# Patient Record
Sex: Female | Born: 1940
Health system: Southern US, Community
[De-identification: ages and names within clinical notes are randomized; demographics above are authoritative.]

## PROBLEM LIST (undated history)

## (undated) DIAGNOSIS — R21 Rash and other nonspecific skin eruption: Secondary | ICD-10-CM

## (undated) DIAGNOSIS — M2550 Pain in unspecified joint: Secondary | ICD-10-CM

## (undated) DIAGNOSIS — M2540 Effusion, unspecified joint: Secondary | ICD-10-CM

## (undated) DIAGNOSIS — R351 Nocturia: Secondary | ICD-10-CM

## (undated) DIAGNOSIS — Z860100 Personal history of colon polyps, unspecified: Secondary | ICD-10-CM

## (undated) DIAGNOSIS — M549 Dorsalgia, unspecified: Secondary | ICD-10-CM

## (undated) DIAGNOSIS — H269 Unspecified cataract: Secondary | ICD-10-CM

## (undated) DIAGNOSIS — J189 Pneumonia, unspecified organism: Secondary | ICD-10-CM

## (undated) DIAGNOSIS — K5790 Diverticulosis of intestine, part unspecified, without perforation or abscess without bleeding: Secondary | ICD-10-CM

## (undated) DIAGNOSIS — K649 Unspecified hemorrhoids: Secondary | ICD-10-CM

## (undated) DIAGNOSIS — R531 Weakness: Secondary | ICD-10-CM

## (undated) DIAGNOSIS — I10 Essential (primary) hypertension: Secondary | ICD-10-CM

## (undated) DIAGNOSIS — D649 Anemia, unspecified: Secondary | ICD-10-CM

## (undated) DIAGNOSIS — M1990 Unspecified osteoarthritis, unspecified site: Secondary | ICD-10-CM

## (undated) DIAGNOSIS — G4700 Insomnia, unspecified: Secondary | ICD-10-CM

## (undated) DIAGNOSIS — Z8709 Personal history of other diseases of the respiratory system: Secondary | ICD-10-CM

## (undated) DIAGNOSIS — M1712 Unilateral primary osteoarthritis, left knee: Secondary | ICD-10-CM

## (undated) HISTORY — PX: APPENDECTOMY: SHX54

## (undated) HISTORY — PX: COLONOSCOPY: SHX174

## (undated) HISTORY — PX: EYE SURGERY: SHX253

## (undated) HISTORY — PX: TONSILLECTOMY: SUR1361

## (undated) HISTORY — DX: Anemia, unspecified: D64.9

## (undated) HISTORY — DX: Essential (primary) hypertension: I10

## (undated) HISTORY — DX: Unspecified cataract: H26.9

## (undated) HISTORY — DX: Unspecified osteoarthritis, unspecified site: M19.90

---

## 2001-10-13 ENCOUNTER — Other Ambulatory Visit: Admission: RE | Admit: 2001-10-13 | Discharge: 2001-10-13 | Payer: Self-pay | Admitting: Obstetrics and Gynecology

## 2003-04-06 ENCOUNTER — Other Ambulatory Visit: Admission: RE | Admit: 2003-04-06 | Discharge: 2003-04-06 | Payer: Self-pay | Admitting: Obstetrics and Gynecology

## 2005-12-31 ENCOUNTER — Ambulatory Visit (HOSPITAL_COMMUNITY): Admission: RE | Admit: 2005-12-31 | Discharge: 2005-12-31 | Payer: Self-pay | Admitting: Neurosurgery

## 2010-10-22 ENCOUNTER — Other Ambulatory Visit: Payer: Self-pay | Admitting: Obstetrics and Gynecology

## 2010-10-22 DIAGNOSIS — N6320 Unspecified lump in the left breast, unspecified quadrant: Secondary | ICD-10-CM

## 2010-11-03 ENCOUNTER — Ambulatory Visit
Admission: RE | Admit: 2010-11-03 | Discharge: 2010-11-03 | Disposition: A | Discharge status: Home / Self Care | Payer: Medicare Other | Source: Ambulatory Visit | Attending: Obstetrics and Gynecology | Admitting: Obstetrics and Gynecology

## 2010-11-03 DIAGNOSIS — N6320 Unspecified lump in the left breast, unspecified quadrant: Secondary | ICD-10-CM

## 2013-11-24 LAB — HM COLONOSCOPY

## 2013-11-24 LAB — HM DEXA SCAN

## 2013-11-24 LAB — HM MAMMOGRAPHY: HM Mammogram: NORMAL (ref 0–4)

## 2014-12-21 ENCOUNTER — Ambulatory Visit (INDEPENDENT_AMBULATORY_CARE_PROVIDER_SITE_OTHER): Payer: Medicare Other | Admitting: Emergency Medicine

## 2014-12-21 VITALS — BP 140/80 | HR 75 | Temp 98.8°F | Resp 20 | Ht 63.0 in | Wt 172.0 lb

## 2014-12-21 DIAGNOSIS — M170 Bilateral primary osteoarthritis of knee: Secondary | ICD-10-CM

## 2014-12-21 DIAGNOSIS — Z01818 Encounter for other preprocedural examination: Secondary | ICD-10-CM

## 2014-12-21 NOTE — Patient Instructions (Signed)

## 2014-12-21 NOTE — Progress Notes (Signed)
Subjective:  Patient ID: Kristina Richard, female    DOB: 1941/06/04  Age: 74 y.o. MRN: [id number]  CC: Follow-up   HPI Kristina Richard presents  for preoperative clearance. She has osteoarthritis in both knees and is scheduled for left total knee arthroplasty in about 6 weeks.  She has no significant past medical history except childhood appendectomy and cataract extraction. Current colonoscopies. She is current on her Pap smear and breast mammograms. She takes no medication on a regular basis. She is a nonsmoker. She denies any current medical complaint.  History Kristina Richard has a past medical history of Arthritis.   She has past surgical history that includes Eye surgery.   Her  family history includes Cancer in her mother; Heart disease in her brother; Hyperlipidemia in her father; Hypertension in her father and maternal grandmother.  She   reports that she has never smoked. She has never used smokeless tobacco. She reports that she drinks alcohol. She reports that she does not use illicit drugs.  No outpatient prescriptions prior to visit.   No facility-administered medications prior to visit.    History   Social History  . Marital Status: Single    Spouse Name: N/A  . Number of Children: N/A  . Years of Education: N/A   Social History Main Topics  . Smoking status: Never Smoker   . Smokeless tobacco: Never Used  . Alcohol Use: 0.0 oz/week    0 Standard drinks or equivalent per week     Comment: social use  . Drug Use: No  . Sexual Activity: Not on file   Other Topics Concern  . None   Social History Narrative  . None     Review of Systems  Constitutional: Negative for fever, chills and appetite change.  HENT: Negative for congestion, ear pain, postnasal drip, sinus pressure and sore throat.   Eyes: Negative for pain and redness.  Respiratory: Negative for cough, shortness of breath and wheezing.   Cardiovascular: Negative for leg swelling.    Gastrointestinal: Negative for nausea, vomiting, abdominal pain, diarrhea, constipation and blood in stool.  Endocrine: Negative for polyuria.  Genitourinary: Negative for dysuria, urgency, frequency and flank pain.  Musculoskeletal: Positive for joint swelling and arthralgias. Negative for gait problem.  Skin: Negative for rash.  Neurological: Negative for weakness and headaches.  Psychiatric/Behavioral: Negative for confusion and decreased concentration. The patient is not nervous/anxious.     Objective:  BP 140/80 mmHg  Pulse 75  Temp(Src) 98.8 F (37.1 C) (Oral)  Resp 20  Ht 5\' 3"  (1.6 m)  Wt 172 lb (78.019 kg)  BMI 30.48 kg/m2  SpO2 98%  Physical Exam  Constitutional: She is oriented to person, place, and time. She appears well-developed and well-nourished. No distress.  HENT:  Head: Normocephalic and atraumatic.  Right Ear: External ear normal.  Left Ear: External ear normal.  Nose: Nose normal.  Eyes: Conjunctivae and EOM are normal. Pupils are equal, round, and reactive to light. No scleral icterus.  Neck: Normal range of motion. Neck supple. No tracheal deviation present.  Cardiovascular: Normal rate, regular rhythm and normal heart sounds.   Pulmonary/Chest: Effort normal. No respiratory distress. She has no wheezes. She has no rales.  Abdominal: She exhibits no mass. There is no tenderness. There is no rebound and no guarding.  Musculoskeletal: She exhibits no edema.  Lymphadenopathy:    She has no cervical adenopathy.  Neurological: She is alert and oriented to person, place, and time. Coordination normal.  Skin: Skin is warm and dry. No rash noted.  Psychiatric: She has a normal mood and affect. Her behavior is normal.      Assessment & Plan:   Kristina Richard was seen today for follow-up.  Diagnoses and all orders for this visit:  Preoperative clearance  Primary osteoarthritis of both knees   I am having Kristina Richard maintain her Lysine, cholecalciferol,  vitamin E, and CALCIUM MAGNESIUM 750 PO.  Meds ordered this encounter  Medications  . Lysine 500 MG TABS    Sig: Take 1 tablet by mouth daily.  . cholecalciferol (VITAMIN D) 1000 UNITS tablet    Sig: Take 1,000 Units by mouth daily.  . vitamin E 1000 UNIT capsule    Sig: Take 1,000 Units by mouth daily.  Marland Kitchen CALCIUM MAGNESIUM 750 PO    Sig: Take 1 capsule by mouth daily.    Appropriate red flag conditions were discussed with the patient as well as actions that should be taken.  Patient expressed his understanding.  I can find no contraindication to proposed surgical procedure.   Follow-up: Return if symptoms worsen or fail to improve.  Roselee Culver, MD

## 2014-12-31 ENCOUNTER — Telehealth: Payer: Self-pay

## 2014-12-31 NOTE — Telephone Encounter (Signed)
Release sent as Continuity of Care -

## 2014-12-31 NOTE — Telephone Encounter (Signed)
Sherri from [facility] called today requesting the pt's office notes from [phone] in order to proceed with the arthoplasty.

## 2015-02-15 ENCOUNTER — Other Ambulatory Visit (HOSPITAL_COMMUNITY): Payer: Medicare Other

## 2015-02-25 ENCOUNTER — Encounter (HOSPITAL_COMMUNITY): Admission: RE | Payer: Self-pay | Source: Ambulatory Visit

## 2015-02-25 ENCOUNTER — Inpatient Hospital Stay (HOSPITAL_COMMUNITY): Admission: RE | Admit: 2015-02-25 | Payer: Medicare Other | Source: Ambulatory Visit | Admitting: Orthopedic Surgery

## 2015-02-25 SURGERY — ARTHROPLASTY, KNEE, TOTAL
Anesthesia: General | Site: Knee | Laterality: Left

## 2015-06-04 ENCOUNTER — Encounter (HOSPITAL_COMMUNITY): Payer: Self-pay | Admitting: Physician Assistant

## 2015-06-04 DIAGNOSIS — M5136 Other intervertebral disc degeneration, lumbar region: Secondary | ICD-10-CM | POA: Diagnosis present

## 2015-06-04 DIAGNOSIS — M1712 Unilateral primary osteoarthritis, left knee: Secondary | ICD-10-CM | POA: Diagnosis present

## 2015-06-04 DIAGNOSIS — M5126 Other intervertebral disc displacement, lumbar region: Secondary | ICD-10-CM | POA: Diagnosis present

## 2015-06-04 DIAGNOSIS — M5090 Cervical disc disorder, unspecified, unspecified cervical region: Secondary | ICD-10-CM | POA: Diagnosis present

## 2015-06-04 NOTE — H&P (Signed)
TOTAL KNEE ADMISSION H&P  Patient is being admitted for left total knee arthroplasty.  Subjective:  Chief Complaint:left knee pain.  HPI: Kristina Richard, 74 y.o. female, has a history of pain and functional disability in the left knee due to arthritis and has failed non-surgical conservative treatments for greater than 12 weeks to includeNSAID's and/or analgesics, corticosteriod injections, viscosupplementation injections, flexibility and strengthening excercises, supervised PT with diminished ADL's post treatment, use of assistive devices, weight reduction as appropriate and activity modification.  Onset of symptoms was gradual, starting 10 years ago with gradually worsening course since that time. The patient noted no past surgery on the left knee(s).  Patient currently rates pain in the left knee(s) at 9 out of 10 with activity. Patient has night pain, worsening of pain with activity and weight bearing, pain that interferes with activities of daily living, crepitus and joint swelling.  Patient has evidence of subchondral sclerosis, periarticular osteophytes and joint space narrowing by imaging studies.  There is no active infection.  Patient Active Problem List   Diagnosis Date Noted  . Primary localized osteoarthritis of left knee   . Bulging lumbar disc   . Cervical disc disease    Past Medical History  Diagnosis Date  . Arthritis   . Primary localized osteoarthritis of left knee   . Bulging lumbar disc   . Cervical disc disease     Past Surgical History  Procedure Laterality Date  . Eye surgery      No current facility-administered medications for this encounter.  Current outpatient prescriptions:  .  CALCIUM MAGNESIUM 750 PO, Take 1 capsule by mouth daily., Disp: , Rfl:  .  cholecalciferol (VITAMIN D) 1000 UNITS tablet, Take 1,000 Units by mouth daily., Disp: , Rfl:  .  Lysine 500 MG TABS, Take 1 tablet by mouth daily., Disp: , Rfl:  .  vitamin E 1000 UNIT capsule, Take 1,000  Units by mouth daily., Disp: , Rfl:     Allergies  Allergen Reactions  . Codeine     Can do codones like hydrocodone or oxycodone      Social History  Substance Use Topics  . Smoking status: Never Smoker   . Smokeless tobacco: Never Used  . Alcohol Use: 0.0 oz/week    0 Standard drinks or equivalent per week     Comment: social use    Family History  Problem Relation Age of Onset  . Cancer Mother   . Hyperlipidemia Father   . Hypertension Father   . Heart disease Brother   . Hypertension Maternal Grandmother      Review of Systems  Constitutional: Negative.   HENT: Negative.   Eyes: Negative.   Respiratory: Negative.   Cardiovascular: Negative.   Gastrointestinal: Negative.   Genitourinary: Negative.   Musculoskeletal: Positive for back pain and joint pain.  Skin: Negative.   Neurological: Negative.   Endo/Heme/Allergies: Negative.   Psychiatric/Behavioral: Negative.     Objective:  Physical Exam  Constitutional: She is oriented to person, place, and time. She appears well-developed and well-nourished.  HENT:  Head: Normocephalic and atraumatic.  Mouth/Throat: Oropharynx is clear and moist.  Eyes: Pupils are equal, round, and reactive to light.  Neck: Normal range of motion. Neck supple.  Cardiovascular: Normal rate, regular rhythm and normal heart sounds.   Respiratory: Effort normal.  GI: Soft.  Genitourinary:  Not pertinent to current symptomatology therefore not examined.  Musculoskeletal:  Examination of her left knee reveals 2+ effusion with pain medially. Moderate  varus deformity.  Range of motion is from 0-110 degrees. The knee is stable with normal patellar tracking. Examination of the right knee reveals 1+ synovitis. There is a full range of motion. Mild pain. The knee is stable with normal patellar tracking. Vascular exam: pulses 2+ and symmetric.   Neurological: She is alert and oriented to person, place, and time.  Skin: Skin is warm and dry.   Psychiatric: She has a normal mood and affect. Her behavior is normal.    Vital signs in last 24 hours:    Labs:   Estimated body mass index is 30.48 kg/(m^2) as calculated from the following:   Height as of 12/21/14: 5\' 3"  (1.6 m).   Weight as of 12/21/14: 78.019 kg (172 lb).   Imaging Review Plain radiographs demonstrate moderate degenerative joint disease of the left knee(s). The overall alignment ismild varus. The bone quality appears to be good for age and reported activity level.  Assessment/Plan:  End stage arthritis, left knee  Principal Problem:   Primary localized osteoarthritis of left knee Active Problems:   Bulging lumbar disc   Cervical disc disease   The patient history, physical examination, clinical judgment of the provider and imaging studies are consistent with end stage degenerative joint disease of the left knee(s) and total knee arthroplasty is deemed medically necessary. The treatment options including medical management, injection therapy arthroscopy and arthroplasty were discussed at length. The risks and benefits of total knee arthroplasty were presented and reviewed. The risks due to aseptic loosening, infection, stiffness, patella tracking problems, thromboembolic complications and other imponderables were discussed. The patient acknowledged the explanation, agreed to proceed with the plan and consent was signed. Patient is being admitted for inpatient treatment for surgery, pain control, PT, OT, prophylactic antibiotics, VTE prophylaxis, progressive ambulation and ADL's and discharge planning. The patient is planning to be discharged home with home health services and to her sister's house

## 2015-06-12 ENCOUNTER — Ambulatory Visit (INDEPENDENT_AMBULATORY_CARE_PROVIDER_SITE_OTHER): Payer: Medicare Other | Admitting: Family Medicine

## 2015-06-12 ENCOUNTER — Encounter (HOSPITAL_COMMUNITY)
Admission: RE | Admit: 2015-06-12 | Discharge: 2015-06-12 | Disposition: A | Discharge status: Home / Self Care | Payer: Medicare Other | Source: Ambulatory Visit | Attending: Orthopedic Surgery | Admitting: Orthopedic Surgery

## 2015-06-12 ENCOUNTER — Encounter (HOSPITAL_COMMUNITY): Payer: Self-pay

## 2015-06-12 ENCOUNTER — Encounter: Payer: Self-pay | Admitting: Family Medicine

## 2015-06-12 VITALS — BP 160/100 | HR 87 | Temp 98.8°F | Resp 16 | Ht 62.5 in | Wt 165.6 lb

## 2015-06-12 DIAGNOSIS — Z01812 Encounter for preprocedural laboratory examination: Secondary | ICD-10-CM | POA: Insufficient documentation

## 2015-06-12 DIAGNOSIS — R030 Elevated blood-pressure reading, without diagnosis of hypertension: Secondary | ICD-10-CM | POA: Diagnosis not present

## 2015-06-12 DIAGNOSIS — Z01818 Encounter for other preprocedural examination: Secondary | ICD-10-CM | POA: Insufficient documentation

## 2015-06-12 DIAGNOSIS — R0981 Nasal congestion: Secondary | ICD-10-CM | POA: Diagnosis not present

## 2015-06-12 DIAGNOSIS — Z0183 Encounter for blood typing: Secondary | ICD-10-CM | POA: Insufficient documentation

## 2015-06-12 DIAGNOSIS — R9431 Abnormal electrocardiogram [ECG] [EKG]: Secondary | ICD-10-CM | POA: Diagnosis not present

## 2015-06-12 DIAGNOSIS — IMO0001 Reserved for inherently not codable concepts without codable children: Secondary | ICD-10-CM

## 2015-06-12 DIAGNOSIS — M179 Osteoarthritis of knee, unspecified: Secondary | ICD-10-CM | POA: Diagnosis not present

## 2015-06-12 HISTORY — DX: Rash and other nonspecific skin eruption: R21

## 2015-06-12 HISTORY — DX: Pneumonia, unspecified organism: J18.9

## 2015-06-12 HISTORY — DX: Nocturia: R35.1

## 2015-06-12 HISTORY — DX: Diverticulosis of intestine, part unspecified, without perforation or abscess without bleeding: K57.90

## 2015-06-12 HISTORY — DX: Unspecified hemorrhoids: K64.9

## 2015-06-12 HISTORY — DX: Insomnia, unspecified: G47.00

## 2015-06-12 HISTORY — DX: Personal history of colonic polyps: Z86.010

## 2015-06-12 HISTORY — DX: Effusion, unspecified joint: M25.40

## 2015-06-12 HISTORY — DX: Personal history of other diseases of the respiratory system: Z87.09

## 2015-06-12 HISTORY — DX: Dorsalgia, unspecified: M54.9

## 2015-06-12 HISTORY — DX: Pain in unspecified joint: M25.50

## 2015-06-12 HISTORY — DX: Weakness: R53.1

## 2015-06-12 HISTORY — DX: Personal history of colon polyps, unspecified: Z86.0100

## 2015-06-12 LAB — CBC WITH DIFFERENTIAL/PLATELET
BASOS ABS: 0.1 10*3/uL (ref 0.0–0.1)
BASOS PCT: 1 %
EOS ABS: 0.2 10*3/uL (ref 0.0–0.7)
EOS PCT: 3 %
HCT: 45.8 % (ref 36.0–46.0)
Hemoglobin: 15 g/dL (ref 12.0–15.0)
Lymphocytes Relative: 27 %
Lymphs Abs: 1.8 10*3/uL (ref 0.7–4.0)
MCH: 28 pg (ref 26.0–34.0)
MCHC: 32.8 g/dL (ref 30.0–36.0)
MCV: 85.6 fL (ref 78.0–100.0)
MONOS PCT: 9 %
Monocytes Absolute: 0.6 10*3/uL (ref 0.1–1.0)
Neutro Abs: 4.1 10*3/uL (ref 1.7–7.7)
Neutrophils Relative %: 60 %
PLATELETS: 376 10*3/uL (ref 150–400)
RBC: 5.35 MIL/uL — ABNORMAL HIGH (ref 3.87–5.11)
RDW: 12.7 % (ref 11.5–15.5)
WBC: 6.7 10*3/uL (ref 4.0–10.5)

## 2015-06-12 LAB — COMPREHENSIVE METABOLIC PANEL
ALBUMIN: 3.9 g/dL (ref 3.5–5.0)
ALT: 18 U/L (ref 14–54)
ANION GAP: 8 (ref 5–15)
AST: 17 U/L (ref 15–41)
Alkaline Phosphatase: 69 U/L (ref 38–126)
BILIRUBIN TOTAL: 1 mg/dL (ref 0.3–1.2)
BUN: 7 mg/dL (ref 6–20)
CHLORIDE: 101 mmol/L (ref 101–111)
CO2: 30 mmol/L (ref 22–32)
Calcium: 9.3 mg/dL (ref 8.9–10.3)
Creatinine, Ser: 0.67 mg/dL (ref 0.44–1.00)
GFR calc Af Amer: >60 mL/min (ref >60)
GFR calc non Af Amer: >60 mL/min (ref >60)
GLUCOSE: 100 mg/dL — AB (ref 65–99)
POTASSIUM: 3.6 mmol/L (ref 3.5–5.1)
SODIUM: 139 mmol/L (ref 135–145)
TOTAL PROTEIN: 7.1 g/dL (ref 6.5–8.1)

## 2015-06-12 LAB — ABO/RH: ABO/RH(D): O POS

## 2015-06-12 LAB — APTT: APTT: 28 s (ref 24–37)

## 2015-06-12 LAB — TYPE AND SCREEN
ABO/RH(D): O POS
ANTIBODY SCREEN: NEGATIVE

## 2015-06-12 LAB — SURGICAL PCR SCREEN
MRSA, PCR: NEGATIVE
STAPHYLOCOCCUS AUREUS: NEGATIVE

## 2015-06-12 LAB — PROTIME-INR
INR: 0.95 (ref 0.00–1.49)
Prothrombin Time: 12.9 seconds (ref 11.6–15.2)

## 2015-06-12 MED ORDER — CHLORHEXIDINE GLUCONATE 4 % EX LIQD: 60.0000 mL | Freq: Once | CUTANEOUS | Status: DC

## 2015-06-12 MED ORDER — FLUTICASONE PROPIONATE 50 MCG/ACT NA SUSP: 2.0000 | Freq: Every day | NASAL | Status: DC

## 2015-06-12 MED ORDER — POVIDONE-IODINE 7.5 % EX SOLN: Freq: Once | CUTANEOUS | Status: DC

## 2015-06-12 NOTE — Progress Notes (Signed)
[facility] [address] [place] [address] [phone]  Date:  06/12/2015   Name:  Kristina Richard   DOB:  October 18, 1940   MRN:  [id number]  PCP:  Lamar Blinks, MD    Chief Complaint: estab care; sinus headache; and Pre-OP workup this am at [facility]   History of Present Illness:  Kristina Richard is a 74 y.o. very pleasant female patient who presents with the following:  She is here to establish care and discuss a couple of concerns.  She will have a left knee replacement per Dr. Noemi Chapel coming up- this is scheduled on 12/12.  She is excited to have less pain in her knee.  She had her pre-op today.  Noted some Q waves in her chest leads- she has no knowledge of any MI.  She does not have exertional CP.  She is limited some by her orthopedic issues but has decent exercise tolerance.  No old EKG for comparison BP Readings from Last 3 Encounters:  06/12/15 160/100  06/12/15 159/80  12/21/14 140/80   She does not have a history of HTN.    Suspect that elevated BP today may be due to nerves  She declines a flu shot today   She does note that her sinuses feel stuffy- she generally will use sudafed for a day or 2 in this case and they get better   Patient Active Problem List   Diagnosis Date Noted  . Primary localized osteoarthritis of left knee   . Bulging lumbar disc   . Cervical disc disease     Past Medical History  Diagnosis Date  . Arthritis   . Primary localized osteoarthritis of left knee   . Insomnia     takes Xanax nightly as needed for insomnia or anxiety  . Pneumonia     hx of-most recent 5 yrs ago  . History of bronchitis 4 yrs ago  . Weakness     numbness and tingling in both   . Joint pain   . Joint swelling   . Back pain   . Rash     upper body and states that it nefver goes away.Saw a dermatologist  . Hemorrhoids   . History of colon polyps   . Nocturia   . Diverticulosis     Past Surgical History  Procedure Laterality Date   . Tonsillectomy    . Appendectomy    . Colonoscopy    . Eye surgery Bilateral     cataract    Social History  Substance Use Topics  . Smoking status: Never Smoker   . Smokeless tobacco: Never Used  . Alcohol Use: No    Family History  Problem Relation Age of Onset  . Cancer Mother   . Hyperlipidemia Father   . Hypertension Father   . Heart disease Brother   . Hypertension Maternal Grandmother     Allergies  Allergen Reactions  . Morphine And Related     Doesn't want any morphine and sick to stomach  . Codeine Other (See Comments)    Can do codones like hydrocodone or oxycodone    Medication list has been reviewed and updated.  Current Outpatient Prescriptions on File Prior to Visit  Medication Sig Dispense Refill  . LORazepam (ATIVAN) 2 MG tablet Take 1 mg by mouth at bedtime as needed for anxiety or sleep.     Marland Kitchen NIACIN PO Take 400 mg by mouth daily.    . traMADol Veatrice Bourbon)  50 MG tablet Take 50 mg by mouth every 6 (six) hours as needed for moderate pain.    . cholecalciferol (VITAMIN D) 1000 UNITS tablet Take 1,000 Units by mouth daily.    Marland Kitchen Lysine 500 MG TABS Take 500 mg by mouth daily.     . vitamin E 1000 UNIT capsule Take 1,000 Units by mouth daily.     Current Facility-Administered Medications on File Prior to Visit  Medication Dose Route Frequency Provider Last Rate Last Dose  . chlorhexidine (HIBICLENS) 4 % liquid 4 application  60 mL Topical Once Kirstin Shepperson, PA-C      . chlorhexidine (HIBICLENS) 4 % liquid 4 application  60 mL Topical Once Kirstin Shepperson, PA-C      . povidone-iodine (BETADINE) 7.5 % scrub   Topical Once Kirstin Shepperson, PA-C        Review of Systems:  As per HPI- otherwise negative.   Physical Examination: Filed Vitals:   06/12/15 1534 06/12/15 1540  BP: 160/92 160/100  Pulse: 87   Temp: 98.8 F (37.1 C)   Resp: 16    Filed Vitals:   06/12/15 1534  Height: 5' 2.5" (1.588 m)  Weight: 165 lb 9.6 oz (75.116 kg)    Body mass index is 29.79 kg/(m^2). Ideal Body Weight: Weight in (lb) to have BMI = 25: 138.6  GEN: WDWN, NAD, Non-toxic, A & O x 3, well appearing  HEENT: Atraumatic, Normocephalic. Neck supple. No masses, No LAD.  Bilateral TM wnl, oropharynx normal.  PEERL,EOMI.  Nasal cavity injection Ears and Nose: No external deformity. CV: RRR, No M/G/R. No JVD. No thrill. No extra heart sounds. PULM: CTA B, no wheezes, crackles, rhonchi. No retractions. No resp. distress. No accessory muscle use. EXTR: No c/c/e NEURO Normal gait.  PSYCH: Normally interactive. Conversant. Not depressed or anxious appearing.  Calm demeanor.    Assessment and Plan: Nasal congestion - Plan: fluticasone (FLONASE) 50 MCG/ACT nasal spray  Elevated BP  BP is up today- this is not usual for her.  May be due to pre-op nerves.  She will check her BP at home and if still elevated she will call me; in that case may start a low dose of meds so that there are no problems with proceeding to the OR Advised the sudafed may raise her BP- would be ok for a day or two but otherwise prefer that she try flonase    Signed Lamar Blinks, MD

## 2015-06-12 NOTE — Progress Notes (Addendum)
Cardiologist denies having one  Medical Md is Starr  Echo/stress test/heart cath denies ever having  EKG denies ever having one  CXR denies having one in past yr

## 2015-06-12 NOTE — Patient Instructions (Signed)
Best of luck with your operation- if we can help let me know Keep an eye on your blood pressure -if you are running higher than 140/90 please let me know.   In that case we may need to start a low dose of a BP medication  Try the flonase for your nasal symptoms if you like.

## 2015-06-12 NOTE — Pre-Procedure Instructions (Signed)
Kristina Richard  06/12/2015      [facility] - [place], [place] - [address] AT [address] [place] [address] Phone: [phone] Fax: [phone]    Your procedure is scheduled on Mon, Dec 12 @ 7:15 AM  Report to [facility] Admitting at 5:30 AM  Call this number if you have problems the morning of surgery:  [phone]   Remember:  Do not eat food or drink liquids after midnight.  Take these medicines the morning of surgery with A SIP OF WATER Tramadol(Ultram-if needed)             Stop taking your Vitamins along with any Herbal Medications a week prior to surgery. No Goody's,BC's,Aleve,Aspirin,Ibuprofen,Advil,Motrin,or Fish Oil.   Do not wear jewelry, make-up or nail polish.  Do not wear lotions, powders, or perfumes.  You may wear deodorant.  Do not shave 48 hours prior to surgery.    Do not bring valuables to the hospital.  [facility] is not responsible for any belongings or valuables.  Contacts, dentures or bridgework may not be worn into surgery.  Leave your suitcase in the car.  After surgery it may be brought to your room.  For patients admitted to the hospital, discharge time will be determined by your treatment team.  Patients discharged the day of surgery will not be allowed to drive home.    Special instructions:  [facility] - Preparing for Surgery  Before surgery, you can play an important role.  Because skin is not sterile, your skin needs to be as free of germs as possible.  You can reduce the number of germs on you skin by washing with CHG (chlorahexidine gluconate) soap before surgery.  CHG is an antiseptic cleaner which kills germs and bonds with the skin to continue killing germs even after washing.  Please DO NOT use if you have an allergy to CHG or antibacterial soaps.  If your skin becomes reddened/irritated stop using the CHG and inform your nurse when you arrive at Short  Stay.  Do not shave (including legs and underarms) for at least 48 hours prior to the first CHG shower.  You may shave your face.  Please follow these instructions carefully:   1.  Shower with CHG Soap the night before surgery and the                                morning of Surgery.  2.  If you choose to wash your hair, wash your hair first as usual with your       normal shampoo.  3.  After you shampoo, rinse your hair and body thoroughly to remove the                      Shampoo.  4.  Use CHG as you would any other liquid soap.  You can apply chg directly       to the skin and wash gently with scrungie or a clean washcloth.  5.  Apply the CHG Soap to your body ONLY FROM THE NECK DOWN.        Do not use on open wounds or open sores.  Avoid contact with your eyes,       ears, mouth and genitals (private parts).  Wash genitals (private parts)  with your normal soap.  6.  Wash thoroughly, paying special attention to the area where your surgery        will be performed.  7.  Thoroughly rinse your body with warm water from the neck down.  8.  DO NOT shower/wash with your normal soap after using and rinsing off       the CHG Soap.  9.  Pat yourself dry with a clean towel.            10.  Wear clean pajamas.            11.  Place clean sheets on your bed the night of your first shower and do not        sleep with pets.  Day of Surgery  Do not apply any lotions/deoderants the morning of surgery.  Please wear clean clothes to the hospital/surgery center.    Please read over the following fact sheets that you were given. Pain Booklet, Coughing and Deep Breathing, Blood Transfusion Information, MRSA Information and Surgical Site Infection Prevention

## 2015-06-13 LAB — URINE CULTURE: CULTURE: NO GROWTH

## 2015-06-13 NOTE — Progress Notes (Signed)
Anesthesia Chart Review:  Pt is 74 year old female scheduled for L total knee arthroplasty on 06/24/2015 with Dr. Noemi Chapel.   PCP is Dr. Lamar Blinks; pt had first visit to establish care 06/12/15.   PMH includes:  Arthritis. Never smoker. BMI 30  Preoperative labs reviewed.    EKG 06/12/15: NSR. Cannot rule out Anteroseptal infarct, age undetermined  Reviewed case with Dr. Orene Desanctis.   If no changes, I anticipate pt can proceed with surgery as scheduled.   Willeen Cass, FNP-BC [facility]/Anesthesiology Phone: [phone] 06/13/2015 2:46 PM

## 2015-06-23 MED ORDER — CEFAZOLIN SODIUM-DEXTROSE 2-3 GM-% IV SOLR
2.0000 g | INTRAVENOUS | Status: AC
  Administered 2015-06-24: 2 g via INTRAVENOUS
  Filled 2015-06-23: qty 50

## 2015-06-24 ENCOUNTER — Encounter (HOSPITAL_COMMUNITY): Payer: Self-pay | Admitting: Certified Registered"

## 2015-06-24 ENCOUNTER — Inpatient Hospital Stay (HOSPITAL_COMMUNITY): Payer: Medicare Other | Admitting: Vascular Surgery

## 2015-06-24 ENCOUNTER — Encounter (HOSPITAL_COMMUNITY)
Admission: RE | Disposition: A | Discharge status: Home Health | Payer: Self-pay | Source: Ambulatory Visit | Attending: Orthopedic Surgery

## 2015-06-24 ENCOUNTER — Inpatient Hospital Stay (HOSPITAL_COMMUNITY)
Admission: RE | Admit: 2015-06-24 | Discharge: 2015-06-26 | DRG: 470 | Disposition: A | Discharge status: Home Health | Payer: Medicare Other | Source: Ambulatory Visit | Attending: Orthopedic Surgery | Admitting: Orthopedic Surgery

## 2015-06-24 ENCOUNTER — Inpatient Hospital Stay (HOSPITAL_COMMUNITY): Payer: Medicare Other | Admitting: Certified Registered"

## 2015-06-24 DIAGNOSIS — M5126 Other intervertebral disc displacement, lumbar region: Secondary | ICD-10-CM | POA: Diagnosis present

## 2015-06-24 DIAGNOSIS — M179 Osteoarthritis of knee, unspecified: Secondary | ICD-10-CM | POA: Diagnosis present

## 2015-06-24 DIAGNOSIS — M1712 Unilateral primary osteoarthritis, left knee: Principal | ICD-10-CM | POA: Diagnosis present

## 2015-06-24 DIAGNOSIS — Z8249 Family history of ischemic heart disease and other diseases of the circulatory system: Secondary | ICD-10-CM | POA: Diagnosis not present

## 2015-06-24 DIAGNOSIS — M5136 Other intervertebral disc degeneration, lumbar region: Secondary | ICD-10-CM | POA: Diagnosis present

## 2015-06-24 DIAGNOSIS — M25562 Pain in left knee: Secondary | ICD-10-CM | POA: Diagnosis present

## 2015-06-24 DIAGNOSIS — M1710 Unilateral primary osteoarthritis, unspecified knee: Secondary | ICD-10-CM | POA: Diagnosis present

## 2015-06-24 DIAGNOSIS — M5090 Cervical disc disorder, unspecified, unspecified cervical region: Secondary | ICD-10-CM | POA: Diagnosis present

## 2015-06-24 HISTORY — PX: TOTAL KNEE ARTHROPLASTY: SHX125

## 2015-06-24 HISTORY — DX: Unilateral primary osteoarthritis, left knee: M17.12

## 2015-06-24 SURGERY — ARTHROPLASTY, KNEE, TOTAL
Anesthesia: General | Site: Knee | Laterality: Left

## 2015-06-24 MED ORDER — LORAZEPAM 1 MG PO TABS: 1.0000 mg | ORAL_TABLET | Freq: Every evening | ORAL | Status: DC | PRN

## 2015-06-24 MED ORDER — DEXAMETHASONE SODIUM PHOSPHATE 10 MG/ML IJ SOLN
10.0000 mg | Freq: Three times a day (TID) | INTRAMUSCULAR | Status: AC
  Administered 2015-06-24 – 2015-06-25 (×3): 10 mg via INTRAVENOUS
  Filled 2015-06-24 (×3): qty 1

## 2015-06-24 MED ORDER — CEFAZOLIN SODIUM-DEXTROSE 2-3 GM-% IV SOLR
2.0000 g | Freq: Four times a day (QID) | INTRAVENOUS | Status: AC
  Administered 2015-06-24 (×2): 2 g via INTRAVENOUS
  Filled 2015-06-24 (×2): qty 50

## 2015-06-24 MED ORDER — HYDROMORPHONE HCL 1 MG/ML IJ SOLN
INTRAMUSCULAR | Status: AC
  Filled 2015-06-24: qty 1

## 2015-06-24 MED ORDER — PHENOL 1.4 % MT LIQD: 1.0000 | OROMUCOSAL | Status: DC | PRN

## 2015-06-24 MED ORDER — DOCUSATE SODIUM 100 MG PO CAPS
100.0000 mg | ORAL_CAPSULE | Freq: Two times a day (BID) | ORAL | Status: DC
  Administered 2015-06-24 – 2015-06-26 (×4): 100 mg via ORAL
  Filled 2015-06-24 (×4): qty 1

## 2015-06-24 MED ORDER — METOCLOPRAMIDE HCL 5 MG/ML IJ SOLN
5.0000 mg | Freq: Three times a day (TID) | INTRAMUSCULAR | Status: DC | PRN
  Administered 2015-06-24: 10 mg via INTRAVENOUS
  Filled 2015-06-24: qty 2

## 2015-06-24 MED ORDER — PHENYLEPHRINE 40 MCG/ML (10ML) SYRINGE FOR IV PUSH (FOR BLOOD PRESSURE SUPPORT)
PREFILLED_SYRINGE | INTRAVENOUS | Status: AC
  Filled 2015-06-24: qty 10

## 2015-06-24 MED ORDER — PROPOFOL 500 MG/50ML IV EMUL
INTRAVENOUS | Status: DC | PRN
  Administered 2015-06-24: 75 ug/kg/min via INTRAVENOUS

## 2015-06-24 MED ORDER — MIDAZOLAM HCL 5 MG/5ML IJ SOLN
INTRAMUSCULAR | Status: DC | PRN
  Administered 2015-06-24 (×2): 1 mg via INTRAVENOUS

## 2015-06-24 MED ORDER — HYDROMORPHONE HCL 1 MG/ML IJ SOLN
0.5000 mg | INTRAMUSCULAR | Status: DC | PRN
  Administered 2015-06-24 – 2015-06-26 (×6): 1 mg via INTRAVENOUS
  Filled 2015-06-24 (×6): qty 1

## 2015-06-24 MED ORDER — LACTATED RINGERS IV SOLN
INTRAVENOUS | Status: DC | PRN
  Administered 2015-06-24 (×2): via INTRAVENOUS

## 2015-06-24 MED ORDER — PHENYLEPHRINE HCL 10 MG/ML IJ SOLN
INTRAMUSCULAR | Status: DC | PRN
  Administered 2015-06-24 (×3): 40 ug via INTRAVENOUS

## 2015-06-24 MED ORDER — APIXABAN 2.5 MG PO TABS
2.5000 mg | ORAL_TABLET | Freq: Two times a day (BID) | ORAL | Status: DC
  Administered 2015-06-25 – 2015-06-26 (×3): 2.5 mg via ORAL
  Filled 2015-06-24 (×3): qty 1

## 2015-06-24 MED ORDER — DEXAMETHASONE SODIUM PHOSPHATE 10 MG/ML IJ SOLN
INTRAMUSCULAR | Status: AC
  Filled 2015-06-24: qty 1

## 2015-06-24 MED ORDER — PROPOFOL 10 MG/ML IV BOLUS
INTRAVENOUS | Status: AC
  Filled 2015-06-24: qty 20

## 2015-06-24 MED ORDER — MENTHOL 3 MG MT LOZG: 1.0000 | LOZENGE | OROMUCOSAL | Status: DC | PRN

## 2015-06-24 MED ORDER — DIPHENHYDRAMINE HCL 12.5 MG/5ML PO ELIX: 12.5000 mg | ORAL_SOLUTION | ORAL | Status: DC | PRN

## 2015-06-24 MED ORDER — BUPIVACAINE-EPINEPHRINE (PF) 0.25% -1:200000 IJ SOLN
INTRAMUSCULAR | Status: AC
  Filled 2015-06-24: qty 30

## 2015-06-24 MED ORDER — ALUM & MAG HYDROXIDE-SIMETH 200-200-20 MG/5ML PO SUSP: 30.0000 mL | ORAL | Status: DC | PRN

## 2015-06-24 MED ORDER — SODIUM CHLORIDE 0.9 % IR SOLN
Status: DC | PRN
  Administered 2015-06-24: 4000 mL

## 2015-06-24 MED ORDER — MIDAZOLAM HCL 2 MG/2ML IJ SOLN
INTRAMUSCULAR | Status: AC
  Filled 2015-06-24: qty 2

## 2015-06-24 MED ORDER — POLYETHYLENE GLYCOL 3350 17 G PO PACK
17.0000 g | PACK | Freq: Two times a day (BID) | ORAL | Status: DC
  Administered 2015-06-25 – 2015-06-26 (×3): 17 g via ORAL
  Filled 2015-06-24 (×4): qty 1

## 2015-06-24 MED ORDER — PROMETHAZINE HCL 25 MG/ML IJ SOLN: 6.2500 mg | INTRAMUSCULAR | Status: DC | PRN

## 2015-06-24 MED ORDER — BUPIVACAINE IN DEXTROSE 0.75-8.25 % IT SOLN
INTRATHECAL | Status: DC | PRN
  Administered 2015-06-24: 1.8 mL via INTRATHECAL

## 2015-06-24 MED ORDER — HYDROMORPHONE HCL 1 MG/ML IJ SOLN
0.2500 mg | INTRAMUSCULAR | Status: DC | PRN
  Administered 2015-06-24 (×4): 0.5 mg via INTRAVENOUS

## 2015-06-24 MED ORDER — FENTANYL CITRATE (PF) 100 MCG/2ML IJ SOLN
INTRAMUSCULAR | Status: DC | PRN
  Administered 2015-06-24: 25 ug via INTRAVENOUS
  Administered 2015-06-24: 50 ug via INTRAVENOUS

## 2015-06-24 MED ORDER — PROPOFOL 10 MG/ML IV BOLUS
INTRAVENOUS | Status: DC | PRN
  Administered 2015-06-24: 10 mg via INTRAVENOUS
  Administered 2015-06-24 (×2): 20 mg via INTRAVENOUS

## 2015-06-24 MED ORDER — DEXAMETHASONE SODIUM PHOSPHATE 10 MG/ML IJ SOLN
INTRAMUSCULAR | Status: DC | PRN
  Administered 2015-06-24: 10 mg via INTRAVENOUS

## 2015-06-24 MED ORDER — NIACIN 500 MG PO TABS
500.0000 mg | ORAL_TABLET | Freq: Every day | ORAL | Status: DC
  Administered 2015-06-26: 500 mg via ORAL
  Filled 2015-06-24 (×3): qty 1

## 2015-06-24 MED ORDER — STERILE WATER FOR INJECTION IJ SOLN
INTRAMUSCULAR | Status: AC
  Filled 2015-06-24: qty 10

## 2015-06-24 MED ORDER — VITAMIN D 1000 UNITS PO TABS
1000.0000 [IU] | ORAL_TABLET | Freq: Every day | ORAL | Status: DC
  Administered 2015-06-24 – 2015-06-26 (×3): 1000 [IU] via ORAL
  Filled 2015-06-24 (×3): qty 1

## 2015-06-24 MED ORDER — ONDANSETRON HCL 4 MG/2ML IJ SOLN
4.0000 mg | Freq: Four times a day (QID) | INTRAMUSCULAR | Status: DC | PRN
  Administered 2015-06-24 – 2015-06-26 (×3): 4 mg via INTRAVENOUS
  Filled 2015-06-24 (×3): qty 2

## 2015-06-24 MED ORDER — EPHEDRINE SULFATE 50 MG/ML IJ SOLN
INTRAMUSCULAR | Status: DC | PRN
  Administered 2015-06-24 (×2): 5 mg via INTRAVENOUS

## 2015-06-24 MED ORDER — BUPIVACAINE-EPINEPHRINE 0.25% -1:200000 IJ SOLN
INTRAMUSCULAR | Status: DC | PRN
  Administered 2015-06-24: 30 mL

## 2015-06-24 MED ORDER — ACETAMINOPHEN 650 MG RE SUPP: 650.0000 mg | Freq: Four times a day (QID) | RECTAL | Status: DC | PRN

## 2015-06-24 MED ORDER — OXYCODONE HCL 5 MG PO TABS
ORAL_TABLET | ORAL | Status: AC
  Filled 2015-06-24: qty 2

## 2015-06-24 MED ORDER — FENTANYL CITRATE (PF) 250 MCG/5ML IJ SOLN
INTRAMUSCULAR | Status: AC
  Filled 2015-06-24: qty 5

## 2015-06-24 MED ORDER — EPHEDRINE SULFATE 50 MG/ML IJ SOLN
INTRAMUSCULAR | Status: AC
  Filled 2015-06-24: qty 1

## 2015-06-24 MED ORDER — CELECOXIB 200 MG PO CAPS
200.0000 mg | ORAL_CAPSULE | Freq: Two times a day (BID) | ORAL | Status: DC
Start: 2015-06-24 — End: 2015-06-26
  Administered 2015-06-24 – 2015-06-26 (×5): 200 mg via ORAL
  Filled 2015-06-24 (×5): qty 1

## 2015-06-24 MED ORDER — ONDANSETRON HCL 4 MG PO TABS
4.0000 mg | ORAL_TABLET | Freq: Four times a day (QID) | ORAL | Status: DC | PRN
  Administered 2015-06-26: 4 mg via ORAL
  Filled 2015-06-24 (×2): qty 1

## 2015-06-24 MED ORDER — POTASSIUM CHLORIDE IN NACL 20-0.9 MEQ/L-% IV SOLN
INTRAVENOUS | Status: DC
  Administered 2015-06-24 (×2): via INTRAVENOUS
  Filled 2015-06-24 (×2): qty 1000

## 2015-06-24 MED ORDER — METOCLOPRAMIDE HCL 5 MG PO TABS
5.0000 mg | ORAL_TABLET | Freq: Three times a day (TID) | ORAL | Status: DC | PRN
  Administered 2015-06-26: 10 mg via ORAL
  Filled 2015-06-24: qty 2

## 2015-06-24 MED ORDER — ACETAMINOPHEN 325 MG PO TABS
650.0000 mg | ORAL_TABLET | Freq: Four times a day (QID) | ORAL | Status: DC | PRN
  Administered 2015-06-26 (×2): 650 mg via ORAL
  Filled 2015-06-24 (×2): qty 2

## 2015-06-24 MED ORDER — ROPIVACAINE HCL 5 MG/ML IJ SOLN
INTRAMUSCULAR | Status: DC | PRN
  Administered 2015-06-24: 25 mL via PERINEURAL

## 2015-06-24 MED ORDER — OXYCODONE HCL 5 MG PO TABS
5.0000 mg | ORAL_TABLET | ORAL | Status: DC | PRN
  Administered 2015-06-24 (×2): 10 mg via ORAL
  Administered 2015-06-25 – 2015-06-26 (×8): 5 mg via ORAL
  Filled 2015-06-24: qty 1
  Filled 2015-06-24: qty 2
  Filled 2015-06-24 (×2): qty 1
  Filled 2015-06-24 (×2): qty 2
  Filled 2015-06-24 (×2): qty 1
  Filled 2015-06-24: qty 2
  Filled 2015-06-24: qty 1

## 2015-06-24 MED ORDER — LACTATED RINGERS IV SOLN: INTRAVENOUS | Status: DC

## 2015-06-24 MED ORDER — FLUTICASONE PROPIONATE 50 MCG/ACT NA SUSP
2.0000 | Freq: Every day | NASAL | Status: DC
  Filled 2015-06-24: qty 16

## 2015-06-24 SURGICAL SUPPLY — 75 items
APL SKNCLS STERI-STRIP NONHPOA (GAUZE/BANDAGES/DRESSINGS) ×1
BANDAGE ESMARK 6X9 LF (GAUZE/BANDAGES/DRESSINGS) ×1 IMPLANT
BENZOIN TINCTURE PRP APPL 2/3 (GAUZE/BANDAGES/DRESSINGS) ×3 IMPLANT
BLADE SAGITTAL 25.0X1.19X90 (BLADE) ×2 IMPLANT
BLADE SAGITTAL 25.0X1.19X90MM (BLADE) ×1
BLADE SAW RECIP 87.9 MT (BLADE) IMPLANT
BLADE SAW SAG 90X13X1.27 (BLADE) ×3 IMPLANT
BLADE SURG 10 STRL SS (BLADE) ×6 IMPLANT
BNDG CMPR 9X6 STRL LF SNTH (GAUZE/BANDAGES/DRESSINGS) ×1
BNDG CMPR MED 15X6 ELC VLCR LF (GAUZE/BANDAGES/DRESSINGS) ×1
BNDG ELASTIC 6X15 VLCR STRL LF (GAUZE/BANDAGES/DRESSINGS) ×3 IMPLANT
BNDG ESMARK 6X9 LF (GAUZE/BANDAGES/DRESSINGS) ×3
BOWL SMART MIX CTS (DISPOSABLE) ×3 IMPLANT
CAPT KNEE TOTAL 3 ATTUNE ×2 IMPLANT
CEMENT HV SMART SET (Cement) ×6 IMPLANT
CLOSURE WOUND 1/2 X4 (GAUZE/BANDAGES/DRESSINGS) ×1
COVER SURGICAL LIGHT HANDLE (MISCELLANEOUS) ×3 IMPLANT
CUFF TOURNIQUET SINGLE 34IN LL (TOURNIQUET CUFF) ×3 IMPLANT
CUFF TOURNIQUET SINGLE 44IN (TOURNIQUET CUFF) IMPLANT
DECANTER SPIKE VIAL GLASS SM (MISCELLANEOUS) ×3 IMPLANT
DRAPE EXTREMITY T 121X128X90 (DRAPE) ×3 IMPLANT
DRAPE INCISE IOBAN 66X45 STRL (DRAPES) ×3 IMPLANT
DRAPE PROXIMA HALF (DRAPES) ×3 IMPLANT
DRAPE U-SHAPE 47X51 STRL (DRAPES) ×3 IMPLANT
DRSG AQUACEL AG ADV 3.5X14 (GAUZE/BANDAGES/DRESSINGS) ×3 IMPLANT
DRSG PAD ABDOMINAL 8X10 ST (GAUZE/BANDAGES/DRESSINGS) ×6 IMPLANT
DURAPREP 26ML APPLICATOR (WOUND CARE) ×6 IMPLANT
ELECT CAUTERY BLADE 6.4 (BLADE) ×3 IMPLANT
ELECT REM PT RETURN 9FT ADLT (ELECTROSURGICAL) ×3
ELECTRODE REM PT RTRN 9FT ADLT (ELECTROSURGICAL) ×1 IMPLANT
EVACUATOR 1/8 PVC DRAIN (DRAIN) IMPLANT
FACESHIELD WRAPAROUND (MASK) ×3 IMPLANT
FACESHIELD WRAPAROUND OR TEAM (MASK) ×1 IMPLANT
GAUZE SPONGE 4X4 12PLY STRL (GAUZE/BANDAGES/DRESSINGS) ×3 IMPLANT
GLOVE BIO SURGEON STRL SZ7 (GLOVE) ×3 IMPLANT
GLOVE BIOGEL PI IND STRL 7.0 (GLOVE) ×1 IMPLANT
GLOVE BIOGEL PI IND STRL 7.5 (GLOVE) ×1 IMPLANT
GLOVE BIOGEL PI INDICATOR 7.0 (GLOVE) ×2
GLOVE BIOGEL PI INDICATOR 7.5 (GLOVE) ×2
GLOVE SS BIOGEL STRL SZ 7.5 (GLOVE) ×1 IMPLANT
GLOVE SUPERSENSE BIOGEL SZ 7.5 (GLOVE) ×2
GOWN STRL REUS W/ TWL LRG LVL3 (GOWN DISPOSABLE) ×1 IMPLANT
GOWN STRL REUS W/ TWL XL LVL3 (GOWN DISPOSABLE) ×2 IMPLANT
GOWN STRL REUS W/TWL LRG LVL3 (GOWN DISPOSABLE) ×3
GOWN STRL REUS W/TWL XL LVL3 (GOWN DISPOSABLE) ×6
HANDPIECE INTERPULSE COAX TIP (DISPOSABLE) ×3
HOOD PEEL AWAY FACE SHEILD DIS (HOOD) ×6 IMPLANT
IMMOBILIZER KNEE 22 UNIV (SOFTGOODS) ×3 IMPLANT
KIT BASIN OR (CUSTOM PROCEDURE TRAY) ×3 IMPLANT
KIT ROOM TURNOVER OR (KITS) ×3 IMPLANT
MANIFOLD NEPTUNE II (INSTRUMENTS) ×3 IMPLANT
MARKER SKIN DUAL TIP RULER LAB (MISCELLANEOUS) ×3 IMPLANT
NS IRRIG 1000ML POUR BTL (IV SOLUTION) ×3 IMPLANT
PACK TOTAL JOINT (CUSTOM PROCEDURE TRAY) ×3 IMPLANT
PACK UNIVERSAL I (CUSTOM PROCEDURE TRAY) ×3 IMPLANT
PAD ARMBOARD 7.5X6 YLW CONV (MISCELLANEOUS) ×6 IMPLANT
PADDING CAST COTTON 6X4 STRL (CAST SUPPLIES) ×3 IMPLANT
RUBBERBAND STERILE (MISCELLANEOUS) ×3 IMPLANT
SET HNDPC FAN SPRY TIP SCT (DISPOSABLE) ×1 IMPLANT
STRIP CLOSURE SKIN 1/2X4 (GAUZE/BANDAGES/DRESSINGS) ×2 IMPLANT
SUCTION FRAZIER TIP 10 FR DISP (SUCTIONS) ×3 IMPLANT
SUT MNCRL AB 3-0 PS2 18 (SUTURE) ×3 IMPLANT
SUT VIC AB 0 CT1 27 (SUTURE) ×6
SUT VIC AB 0 CT1 27XBRD ANBCTR (SUTURE) ×2 IMPLANT
SUT VIC AB 1 CT1 27 (SUTURE) ×3
SUT VIC AB 1 CT1 27XBRD ANBCTR (SUTURE) ×1 IMPLANT
SUT VIC AB 2-0 CT1 27 (SUTURE) ×6
SUT VIC AB 2-0 CT1 TAPERPNT 27 (SUTURE) ×2 IMPLANT
SYR 30ML SLIP (SYRINGE) ×3 IMPLANT
TOWEL OR 17X24 6PK STRL BLUE (TOWEL DISPOSABLE) ×3 IMPLANT
TOWEL OR 17X26 10 PK STRL BLUE (TOWEL DISPOSABLE) ×3 IMPLANT
TRAY FOLEY CATH 16FR SILVER (SET/KITS/TRAYS/PACK) ×3 IMPLANT
TUBE CONNECTING 12'X1/4 (SUCTIONS) ×1
TUBE CONNECTING 12X1/4 (SUCTIONS) ×2 IMPLANT
YANKAUER SUCT BULB TIP NO VENT (SUCTIONS) ×3 IMPLANT

## 2015-06-24 NOTE — Interval H&P Note (Signed)
History and Physical Interval Note:  06/24/2015 6:49 AM  [name]  has presented today for surgery, with the diagnosis of PRIMARY LOCALIZED OA LEFT KNEE  The various methods of treatment have been discussed with the patient and family. After consideration of risks, benefits and other options for treatment, the patient has consented to  Procedure(s): TOTAL KNEE ARTHROPLASTY (Left) as a surgical intervention .  The patient's history has been reviewed, patient examined, no change in status, stable for surgery.  I have reviewed the patient's chart and labs.  Questions were answered to the patient's satisfaction.     Kristina Richard A

## 2015-06-24 NOTE — Progress Notes (Signed)
Utilization review completed.  

## 2015-06-24 NOTE — Anesthesia Procedure Notes (Addendum)
Anesthesia Regional Block:  Femoral nerve block  Pre-Anesthetic Checklist: ,, timeout performed, Correct Patient, Correct Site, Correct Laterality, Correct Procedure, Correct Position, site marked, Risks and benefits discussed,  Surgical consent,  Pre-op evaluation,  At surgeon's request and post-op pain management  Laterality: Left  Prep: chloraprep       Needles:  Injection technique: Single-shot  Needle Type: Echogenic Needle     Needle Length: 9cm 9 cm Needle Gauge: 21 and 21 G    Additional Needles:  Procedures: ultrasound guided (picture in chart) Femoral nerve block Narrative:  Injection made incrementally with aspirations every 5 mL.  Performed by: Personally   Additional Notes: Patient tolerated the procedure well without complications   Spinal Patient location during procedure: OR Staffing Performed by: anesthesiologist  Preanesthetic Checklist Completed: patient identified, site marked, surgical consent, pre-op evaluation, timeout performed, IV checked, risks and benefits discussed and monitors and equipment checked Spinal Block Patient position: sitting Prep: Betadine Patient monitoring: heart rate, continuous pulse ox and blood pressure Injection technique: single-shot Needle Needle type: Spinocan  Needle gauge: 22 G Needle length: 9 cm Additional Notes Expiration date of kit checked and confirmed. Patient tolerated procedure well, without complications.

## 2015-06-24 NOTE — Transfer of Care (Signed)
Immediate Anesthesia Transfer of Care Note  Patient: Kristina Richard  Procedure(s) Performed: Procedure(s): TOTAL KNEE ARTHROPLASTY (Left)  Patient Location: PACU  Anesthesia Type:Spinal  Level of Consciousness: awake, alert  and oriented  Airway & Oxygen Therapy: Patient Spontanous Breathing and Patient connected to nasal cannula oxygen  Post-op Assessment: Report given to RN and Post -op Vital signs reviewed and stable  Post vital signs: Reviewed and stable  Last Vitals:  Filed Vitals:   06/24/15 0628 06/24/15 0930  BP: 168/77   Pulse:    Temp:  36 C  Resp:      Complications: No apparent anesthesia complications

## 2015-06-24 NOTE — Op Note (Signed)
MRN:     [id number] DOB/AGE:    01/14/1941 / 74 y.o.       OPERATIVE REPORT    DATE OF PROCEDURE:  06/24/2015       PREOPERATIVE DIAGNOSIS:  PRIMARY LOCALIZED  OA LEFT KNEE      Estimated body mass index is 30.94 kg/(m^2) as calculated from the following:   Height as of 06/12/15: 5' 2.5" (1.588 m).   Weight as of 12/21/14: 78.019 kg (172 lb).                                                        POSTOPERATIVE DIAGNOSIS:   SAME                                                                     PROCEDURE:  Procedure(s): TOTAL KNEE ARTHROPLASTY Using Depuy Attune RP implants #5 Femur, #5Tibia, 31mm attune RP bearing, 32 Patella     SURGEON: Lilburn Straw A    ASSISTANT:  Kirstin Shepperson PA-C   (Present and scrubbed throughout the case, critical for assistance with exposure, retraction, instrumentation, and closure.)         ANESTHESIA: Spinal with Femoral Nerve Block  DRAINS: foley, 2 medium hemovac in knee   TOURNIQUET TIME: [id number]   COMPLICATIONS:  None     SPECIMENS: None   INDICATIONS FOR PROCEDURE: The patient has  OA LEFT KNEE, varus deformities, XR shows bone on bone arthritis. Patient has failed all conservative measures including anti-inflammatory medicines, narcotics, attempts at  exercise and weight loss, cortisone injections and viscosupplementation.  Risks and benefits of surgery have been discussed, questions answered.   DESCRIPTION OF PROCEDURE: The patient identified by armband, received  right femoral nerve block and IV antibiotics, in the holding area at [facility]. Patient taken to the operating room, appropriate anesthetic  monitors were attached General endotracheal anesthesia induced with  the patient in supine position, Foley catheter was inserted. Tourniquet  applied high to the operative thigh. Lateral post and foot positioner  applied to the table, the lower extremity was then prepped and draped  in usual sterile fashion from the ankle to the  tourniquet. Time-out procedure was performed. The limb was wrapped with an Esmarch bandage and the tourniquet inflated to 365 mmHg. We began the operation by making the anterior midline incision starting at handbreadth above the patella going over the patella 1 cm medial to and  4 cm distal to the tibial tubercle. Small bleeders in the skin and the  subcutaneous tissue identified and cauterized. Transverse retinaculum was incised and reflected medially and a medial parapatellar arthrotomy was accomplished. the patella was everted and theprepatellar fat pad resected. The superficial medial collateral  ligament was then elevated from anterior to posterior along the proximal  flare of the tibia and anterior half of the menisci resected. The knee was hyperflexed exposing bone on bone arthritis. Peripheral and notch osteophytes as well as the cruciate ligaments were then resected. We continued to  work our way around posteriorly along the proximal tibia, and externally  rotated the  tibia subluxing it out from underneath the femur. A McHale  retractor was placed through the notch and a lateral Hohmann retractor  placed, and we then drilled through the proximal tibia in line with the  axis of the tibia followed by an intramedullary guide rod and 2-degree  posterior slope cutting guide. The tibial cutting guide was pinned into place  allowing resection of 4 mm of bone medially and about 6 mm of bone  laterally because of her varus deformity. Satisfied with the tibial resection, we then  entered the distal femur 2 mm anterior to the PCL origin with the  intramedullary guide rod and applied the distal femoral cutting guide  set at 34mm, with 5 degrees of valgus. This was pinned along the  epicondylar axis. At this point, the distal femoral cut was accomplished without difficulty. We then sized for a #5 femoral component and pinned the guide in 3 degrees of external rotation.The chamfer cutting guide was pinned  into place. The anterior, posterior, and chamfer cuts were accomplished without difficulty followed by  the Attune RP box cutting guide and the box cut. We also removed posterior osteophytes from the posterior femoral condyles. At this  time, the knee was brought into full extension. We checked our  extension and flexion gaps and found them symmetric at 107mm.  The patella thickness measured at 25 mm. We set the cutting guide at 15 and removed the posterior 9.5-10 mm  of the patella sized for 32 button and drilled the lollipop. The knee  was then once again hyperflexed exposing the proximal tibia. We sized for a #5 tibial base plate, applied the smokestack and the conical reamer followed by the the Delta fin keel punch. We then hammered into place the Attune RP trial femoral component, inserted a 5-mm trial bearing, trial patellar button, and took the knee through range of motion from 0-130 degrees. No thumb pressure was required for patellar  tracking. At this point, all trial components were removed, a double batch of DePuy HV cement  was mixed and applied to all bony metallic mating surfaces except for the posterior condyles of the femur itself. In order, we  hammered into place the tibial tray and removed excess cement, the femoral component and removed excess cement, a 5-mm attune RP bearing  was inserted, and the knee brought to full extension with compression.  The patellar button was clamped into place, and excess cement  removed. While the cement cured the wound was irrigated out with normal saline solution pulse lavage.. Ligament stability and patellar tracking were checked and found to be excellent. The tourniquet was then released and hemostasis was obtained with cautery. The parapatellar arthrotomy was closed with  #1 ethibond suture. The subcutaneous tissue with 0 and 2-0 undyed  Vicryl suture, and 4-0 Monocryl.. A dressing of Xeroform,  4 x 4, dressing sponges, Webril, and Ace wrap applied.  Needle and sponge count were correct times 2.The patient awakened, extubated, and taken to recovery room without difficulty. Vascular status was normal, pulses 2+ and symmetric.   Jyasia Markoff A 06/24/2015, 8:57 AM

## 2015-06-24 NOTE — Evaluation (Signed)
Physical Therapy Evaluation Patient Details Name: Kristina Richard MRN: [id number] DOB: 1941-07-05 Today's Date: 06/24/2015   History of Present Illness  Admitted for L TKA; WBAT  Past Medical History  Diagnosis Date  . Arthritis   . Primary localized osteoarthritis of left knee   . Insomnia     takes Xanax nightly as needed for insomnia or anxiety  . Pneumonia     hx of-most recent 5 yrs ago  . History of bronchitis 4 yrs ago  . Weakness     numbness and tingling in both   . Joint pain   . Joint swelling   . Back pain   . Rash     upper body and states that it nefver goes away.Saw a dermatologist  . Hemorrhoids   . History of colon polyps   . Nocturia   . Diverticulosis    Past Surgical History  Procedure Laterality Date  . Tonsillectomy    . Appendectomy    . Colonoscopy    . Eye surgery Bilateral     cataract     Clinical Impression  Pt is s/p TKA resulting in the deficits listed below (see PT Problem List).  Pt will benefit from skilled PT to increase their independence and safety with mobility to allow discharge to the venue listed below.      Follow Up Recommendations Home health PT;Supervision/Assistance - 24 hour    Equipment Recommendations  Rolling walker with 5" wheels;3in1 (PT)    Recommendations for Other Services OT consult     Precautions / Restrictions Precautions Precautions: Knee Required Braces or Orthoses: Knee Immobilizer - Left Knee Immobilizer - Left: On when out of bed or walking;Discontinue once straight leg raise with < 10 degree lag Restrictions Weight Bearing Restrictions: Yes LLE Weight Bearing: Weight bearing as tolerated      Mobility  Bed Mobility Overal bed mobility: Needs Assistance Bed Mobility: Supine to Sit     Supine to sit: Min guard     General bed mobility comments: Cues for technqiue  Transfers Overall transfer level: Needs assistance Equipment used: Rolling walker (2 wheeled) Transfers: Sit to/from  Stand Sit to Stand: Min assist         General transfer comment: Cues for safety and hand placement  Ambulation/Gait Ambulation/Gait assistance: Min assist Ambulation Distance (Feet):  (pivot steps bed to chair) Assistive device: Rolling walker (2 wheeled) Gait Pattern/deviations: Step-through pattern     General Gait Details: Cues for gait sequence and to activate quad for stance stability  Stairs            Wheelchair Mobility    Modified Rankin (Stroke Patients Only)       Balance Overall balance assessment: Needs assistance           Standing balance-Leahy Scale: Fair                               Pertinent Vitals/Pain Pain Assessment: 0-10 Pain Score: 3  Pain Location: L knee -- pt also with nausea and lightheadedness Pain Descriptors / Indicators: Aching Pain Intervention(s): Limited activity within patient's tolerance;Monitored during session    Home Living Family/patient expects to be discharged to:: Private residence Living Arrangements: Other relatives Available Help at Discharge: Family;Available 24 hours/day Type of Home: House Home Access: Stairs to enter Entrance Stairs-Rails: Right (need to verify which side) Entrance Stairs-Number of Steps: 3 Home Layout: One level Home Equipment: None  Prior Function Level of Independence: Independent               Hand Dominance        Extremity/Trunk Assessment   Upper Extremity Assessment: Overall WFL for tasks assessed           Lower Extremity Assessment: LLE deficits/detail   LLE Deficits / Details: Grossly decr strength postop; good quad activation; able to straight leg raise     Communication   Communication: No difficulties  Cognition Arousal/Alertness: Awake/alert Behavior During Therapy: WFL for tasks assessed/performed Overall Cognitive Status: Within Functional Limits for tasks assessed                      General Comments       Exercises Total Joint Exercises Quad Sets: AROM;Left;5 reps Heel Slides: AAROM;Left (2 reps) Straight Leg Raises: AAROM;Left;5 reps Goniometric ROM: approx 0-80 deg      Assessment/Plan    PT Assessment Patient needs continued PT services  PT Diagnosis Difficulty walking;Acute pain   PT Problem List Decreased strength;Decreased range of motion;Decreased activity tolerance;Decreased balance;Decreased mobility;Decreased knowledge of use of DME;Decreased knowledge of precautions;Pain  PT Treatment Interventions DME instruction;Gait training;Stair training;Functional mobility training;Therapeutic activities;Therapeutic exercise;Patient/family education   PT Goals (Current goals can be found in the Care Plan section) Acute Rehab PT Goals Patient Stated Goal: to go up and down staris without pain PT Goal Formulation: With patient Time For Goal Achievement: 07/01/15 Potential to Achieve Goals: Good    Frequency BID   Barriers to discharge        Co-evaluation               End of Session Equipment Utilized During Treatment: Gait belt Activity Tolerance: Patient tolerated treatment well (thoguh limited by nausea) Patient left: in chair;with call bell/phone within reach Nurse Communication: Mobility status         Time: 1347-1416 PT Time Calculation (min) (ACUTE ONLY): 29 min   Charges:   PT Evaluation $Initial PT Evaluation Tier I: 1 Procedure PT Treatments $Therapeutic Activity: 8-22 mins   PT G Codes:        Quin Hoop 06/24/2015, 4:20 PM  Roney Marion, [place]  Acute Rehabilitation Services Pager [phone] Office [phone]

## 2015-06-24 NOTE — Anesthesia Preprocedure Evaluation (Signed)
Anesthesia Evaluation  Patient identified by MRN, date of birth, ID band Patient awake    Reviewed: Allergy & Precautions, NPO status , Patient's Chart, lab work & pertinent test results  Airway Mallampati: II  TM Distance: >3 FB Neck ROM: Full    Dental no notable dental hx.    Pulmonary neg pulmonary ROS,    Pulmonary exam normal breath sounds clear to auscultation       Cardiovascular negative cardio ROS Normal cardiovascular exam Rhythm:Regular Rate:Normal     Neuro/Psych negative neurological ROS  negative psych ROS   GI/Hepatic negative GI ROS, Neg liver ROS,   Endo/Other  negative endocrine ROS  Renal/GU negative Renal ROS  negative genitourinary   Musculoskeletal negative musculoskeletal ROS (+)   Abdominal   Peds negative pediatric ROS (+)  Hematology negative hematology ROS (+)   Anesthesia Other Findings   Reproductive/Obstetrics negative OB ROS                             Anesthesia Physical Anesthesia Plan  ASA: II  Anesthesia Plan: General   Post-op Pain Management: GA combined w/ Regional for post-op pain   Induction: Intravenous  Airway Management Planned: LMA and Oral ETT  Additional Equipment:   Intra-op Plan:   Post-operative Plan: Extubation in OR  Informed Consent: I have reviewed the patients History and Physical, chart, labs and discussed the procedure including the risks, benefits and alternatives for the proposed anesthesia with the patient or authorized representative who has indicated his/her understanding and acceptance.   Dental advisory given  Plan Discussed with: CRNA and Surgeon  Anesthesia Plan Comments:         Anesthesia Quick Evaluation

## 2015-06-25 ENCOUNTER — Encounter (HOSPITAL_COMMUNITY): Payer: Self-pay | Admitting: Orthopedic Surgery

## 2015-06-25 LAB — BASIC METABOLIC PANEL
ANION GAP: 8 (ref 5–15)
BUN: 9 mg/dL (ref 6–20)
CALCIUM: 8.8 mg/dL — AB (ref 8.9–10.3)
CHLORIDE: 104 mmol/L (ref 101–111)
CO2: 25 mmol/L (ref 22–32)
Creatinine, Ser: 0.68 mg/dL (ref 0.44–1.00)
GFR calc non Af Amer: >60 mL/min (ref >60)
GLUCOSE: 143 mg/dL — AB (ref 65–99)
POTASSIUM: 4.4 mmol/L (ref 3.5–5.1)
Sodium: 137 mmol/L (ref 135–145)

## 2015-06-25 LAB — CBC
HEMATOCRIT: 34.2 % — AB (ref 36.0–46.0)
HEMOGLOBIN: 11.4 g/dL — AB (ref 12.0–15.0)
MCH: 28.4 pg (ref 26.0–34.0)
MCHC: 33.3 g/dL (ref 30.0–36.0)
MCV: 85.1 fL (ref 78.0–100.0)
Platelets: 321 10*3/uL (ref 150–400)
RBC: 4.02 MIL/uL (ref 3.87–5.11)
RDW: 12.7 % (ref 11.5–15.5)
WBC: 18.6 10*3/uL — AB (ref 4.0–10.5)

## 2015-06-25 NOTE — Discharge Instructions (Signed)
Information on my medicine - ELIQUIS (apixaban)  This medication education was reviewed with me or my healthcare representative as part of my discharge preparation.  The pharmacist that spoke with me during my hospital stay was:  Arty Baumgartner, [facility]  Why was Eliquis prescribed for you? Eliquis was prescribed for you to reduce the risk of blood clots forming after orthopedic surgery.    What do You need to know about Eliquis? Take your Eliquis TWICE DAILY - one tablet in the morning and one tablet in the evening with or without food.  It would be best to take the dose about the same time each day.  If you have difficulty swallowing the tablet whole please discuss with your pharmacist how to take the medication safely.  Take Eliquis exactly as prescribed by your doctor and DO NOT stop taking Eliquis without talking to the doctor who prescribed the medication.  Stopping without other medication to take the place of Eliquis may increase your risk of developing a clot.  After discharge, you should have regular check-up appointments with your healthcare provider that is prescribing your Eliquis.  What do you do if you miss a dose? If a dose of ELIQUIS is not taken at the scheduled time, take it as soon as possible on the same day and twice-daily administration should be resumed.  The dose should not be doubled to make up for a missed dose.  Do not take more than one tablet of ELIQUIS at the same time.  Important Safety Information A possible side effect of Eliquis is bleeding. You should call your healthcare provider right away if you experience any of the following: ? Bleeding from an injury or your nose that does not stop. ? Unusual colored urine (red or dark brown) or unusual colored stools (red or black). ? Unusual bruising for unknown reasons. ? A serious fall or if you hit your head (even if there is no bleeding).  Some medicines may interact with Eliquis and might  increase your risk of bleeding or clotting while on Eliquis. To help avoid this, consult your healthcare provider or pharmacist prior to using any new prescription or non-prescription medications, including herbals, vitamins, non-steroidal anti-inflammatory drugs (NSAIDs) and supplements.  This website has more information on Eliquis (apixaban): http://[email or web address].com/eliquis/home

## 2015-06-25 NOTE — Progress Notes (Signed)
Physical Therapy Treatment Patient Details Name: Kristina Richard MRN: [id number] DOB: January 18, 1941 Today's Date: 06/25/2015    History of Present Illness Admitted for L TKA; WBAT    PT Comments    Noting excellent progress with gait and mobility; on track for dc home tomorrow  Follow Up Recommendations  Home health PT;Supervision/Assistance - 24 hour     Equipment Recommendations  Rolling walker with 5" wheels;3in1 (PT)    Recommendations for Other Services       Precautions / Restrictions Precautions Precautions: Knee Precaution Booklet Issued: Yes (comment) Precaution Comments: Educated pt on no pillow under knee and use of zero degree bone foam Restrictions LLE Weight Bearing: Weight bearing as tolerated    Mobility  Bed Mobility Overal bed mobility: Needs Assistance Bed Mobility: Sit to Supine       Sit to supine: Min guard   General bed mobility comments: Managing well  Transfers Overall transfer level: Needs assistance Equipment used: Rolling walker (2 wheeled) Transfers: Sit to/from Stand Sit to Stand: Supervision         General transfer comment: Supervision for safety. Good hand placement and technique.   Ambulation/Gait Ambulation/Gait assistance: Supervision Ambulation Distance (Feet): 240 Feet Assistive device: Rolling walker (2 wheeled) Gait Pattern/deviations: Step-through pattern Gait velocity: approaching normal   General Gait Details: Cues to push foot and heel into floor with loading LLE into stance for better quad and gluteal contraction   Stairs            Wheelchair Mobility    Modified Rankin (Stroke Patients Only)       Balance             Standing balance-Leahy Scale: Fair                      Cognition Arousal/Alertness: Awake/alert Behavior During Therapy: WFL for tasks assessed/performed Overall Cognitive Status: Within Functional Limits for tasks assessed                       Exercises      General Comments        Pertinent Vitals/Pain Pain Assessment: 0-10 Pain Score: 4  Pain Location: L knee at tibial tuberosity Pain Descriptors / Indicators: Aching Pain Intervention(s): Monitored during session    Home Living                      Prior Function            PT Goals (current goals can now be found in the care plan section) Acute Rehab PT Goals Patient Stated Goal: to go home tomorrow PT Goal Formulation: With patient Time For Goal Achievement: 07/01/15 Potential to Achieve Goals: Good Progress towards PT goals: Progressing toward goals    Frequency  7X/week    PT Plan Current plan remains appropriate    Co-evaluation             End of Session   Activity Tolerance: Patient tolerated treatment well Patient left: in bed;in CPM;with call bell/phone within reach;with family/visitor present     Time: [id number] PT Time Calculation (min) (ACUTE ONLY): 14 min  Charges:  $Gait Training: 8-22 mins                    G Codes:      Quin Hoop 06/25/2015, 4:52 PM  Roney Marion, [place] Pager [phone] Office [phone]

## 2015-06-25 NOTE — Progress Notes (Signed)
Physical Therapy Treatment Patient Details Name: Kristina Richard MRN: [id number] DOB: 1941-06-30 Today's Date: 06/25/2015    History of Present Illness Admitted for L TKA; WBAT    PT Comments    Excellent progress with functional mobility; Pain better managed this session; Some lightheadedness, likely related to pain meds as it did not change with different positions; on track for dc tomorrow  Follow Up Recommendations  Home health PT;Supervision/Assistance - 24 hour     Equipment Recommendations  Rolling walker with 5" wheels;3in1 (PT)    Recommendations for Other Services       Precautions / Restrictions Precautions Precautions: Knee Precaution Booklet Issued: Yes (comment) Precaution Comments: Educated pt on no pillow under knee and use of zero degree bone foam Restrictions Weight Bearing Restrictions: Yes LLE Weight Bearing: Weight bearing as tolerated    Mobility  Bed Mobility               General bed mobility comments: Pt OOB in chair   Transfers Overall transfer level: Needs assistance Equipment used: Rolling walker (2 wheeled) Transfers: Sit to/from Stand Sit to Stand: Supervision         General transfer comment: Supervision for safety. Good hand placement and technique.   Ambulation/Gait Ambulation/Gait assistance: Min guard;Supervision Ambulation Distance (Feet): 520 Feet Assistive device: Rolling walker (2 wheeled) Gait Pattern/deviations: Step-through pattern Gait velocity: approaching normal   General Gait Details: Cues to push foot and heel into floor with loading LLE into stance for better quad and gluteal contraction   Stairs Stairs: Yes Stairs assistance: Min guard Stair Management: One rail Right;Step to pattern;Forwards Number of Stairs: 3 General stair comments: Cues for sequence and technqiue; managing quite well  Wheelchair Mobility    Modified Rankin (Stroke Patients Only)       Balance Overall balance assessment:  Needs assistance         Standing balance support: No upper extremity supported;During functional activity Standing balance-Leahy Scale: Fair (approaching good) Standing balance comment: Pt able to stand at sink and wash hands with no UE support                    Cognition Arousal/Alertness: Awake/alert Behavior During Therapy: WFL for tasks assessed/performed Overall Cognitive Status: Within Functional Limits for tasks assessed                      Exercises Total Joint Exercises Quad Sets: AROM;Left;10 reps Heel Slides: AAROM;Left;10 reps Straight Leg Raises: AROM;Left;10 reps (quad lag present, but minimal) Goniometric ROM: 0-95    General Comments        Pertinent Vitals/Pain Pain Assessment: 0-10 Pain Score: 4  Pain Location: L knee Pain Descriptors / Indicators: Burning Pain Intervention(s): Monitored during session;Premedicated before session    Home Living Family/patient expects to be discharged to:: Private residence Living Arrangements: Other relatives Available Help at Discharge: Family;Available 24 hours/day Type of Home: House Home Access: Stairs to enter Entrance Stairs-Rails: Right Home Layout: One level Home Equipment: Tub bench (borrowed from sister )      Prior Function Level of Independence: Independent          PT Goals (current goals can now be found in the care plan section) Acute Rehab PT Goals Patient Stated Goal: to go home tomorrow PT Goal Formulation: With patient Time For Goal Achievement: 07/01/15 Potential to Achieve Goals: Good Progress towards PT goals: Progressing toward goals    Frequency  7X/week    PT  Plan Current plan remains appropriate    Co-evaluation             End of Session   Activity Tolerance: Patient tolerated treatment well Patient left: in chair;with call bell/phone within reach;with family/visitor present     Time: [id number] PT Time Calculation (min) (ACUTE ONLY): 41  min  Charges:  $Gait Training: 23-37 mins $Therapeutic Exercise: 8-22 mins                    G Codes:      Roney Marion Hamff 06/25/2015, 1:06 PM  Roney Marion, [place] Pager [phone] Office [phone]

## 2015-06-25 NOTE — Evaluation (Signed)
Occupational Therapy Evaluation Patient Details Name: Kristina Richard MRN: [id number] DOB: August 21, 1940 Today's Date: 06/25/2015    History of Present Illness Admitted for L TKA; WBAT   Clinical Impression   Pt reports she was independent with ADLs and mobility PTA. Currently pt is overall supervision for ADLs and functional mobility with the exception of min assist for LB ADLs. Began ADL and safety education with pt and her sister; they verbalize understanding. Pt planning to d/c with 24/7 supervision from her sister. Pt would benefit from continued skilled OT in order to maximize independence and safety with tub transfers.     Follow Up Recommendations  No OT follow up;Supervision/Assistance - 24 hour    Equipment Recommendations  3 in 1 bedside comode    Recommendations for Other Services       Precautions / Restrictions Precautions Precautions: Knee Precaution Comments: Educated pt on no pillow under knee and use of zero degree bone foam Restrictions Weight Bearing Restrictions: Yes LLE Weight Bearing: Weight bearing as tolerated      Mobility Bed Mobility               General bed mobility comments: Pt OOB in chair   Transfers Overall transfer level: Needs assistance Equipment used: Rolling walker (2 wheeled) Transfers: Sit to/from Stand Sit to Stand: Supervision         General transfer comment: Supervision for safety. Good hand placement and technique. Sit to stand from chair x 1, BSC x 1    Balance Overall balance assessment: Needs assistance         Standing balance support: No upper extremity supported;During functional activity Standing balance-Leahy Scale: Fair Standing balance comment: Pt able to stand at sink and wash hands with no UE support                            ADL Overall ADL's : Needs assistance/impaired Eating/Feeding: Set up;Sitting   Grooming: Supervision/safety;Wash/dry hands;Standing       Lower Body  Bathing: Minimal assistance;Sit to/from stand Lower Body Bathing Details (indicate cue type and reason): Discussed use of chair in bathroom to sponge bathe at sink until able to get dressing wet in shower.     Lower Body Dressing: Minimal assistance;Sit to/from stand Lower Body Dressing Details (indicate cue type and reason): Pt able to reach bilateral feet but unable to doff/don L sock. Educated on compensatory strategies for LB ADLs; pt and sister verbalize understanding. Sister reports she can assist with ADLs as needed.   Toilet Transfer: Supervision/safety;Ambulation;BSC;RW (BSC over toilet) Toilet Transfer Details (indicate cue type and reason): Educated on use of 3 in 1 over toilet Toileting- Clothing Manipulation and Hygiene: Supervision/safety;Sitting/lateral lean;Sit to/from stand (sitting for toilet hygiene, stand for clothing manipulation)     Tub/Shower Transfer Details (indicate cue type and reason): Educated on use of tub bench and 3 in 1 in shower; will plan to practice next session. Functional mobility during ADLs: Supervision/safety;Rolling walker General ADL Comments: Pts sister present for OT eval. Educated on home safety, need for supervision during ADLs and mobility, edema management techniques; pt and sister verbalize understanding.      Vision     Perception     Praxis      Pertinent Vitals/Pain Pain Assessment: 0-10 Pain Score: 6  Pain Location: L knee Pain Descriptors / Indicators: Aching;Sore;Grimacing Pain Intervention(s): Limited activity within patient's tolerance;Monitored during session;Repositioned;Ice applied;Premedicated before session     Hand  Dominance     Extremity/Trunk Assessment Upper Extremity Assessment Upper Extremity Assessment: Overall WFL for tasks assessed   Lower Extremity Assessment Lower Extremity Assessment: Defer to PT evaluation   Cervical / Trunk Assessment Cervical / Trunk Assessment: Normal   Communication  Communication Communication: No difficulties   Cognition Arousal/Alertness: Awake/alert Behavior During Therapy: WFL for tasks assessed/performed Overall Cognitive Status: Within Functional Limits for tasks assessed                     General Comments       Exercises       Shoulder Instructions      Home Living Family/patient expects to be discharged to:: Private residence Living Arrangements: Other relatives Available Help at Discharge: Family;Available 24 hours/day Type of Home: House Home Access: Stairs to enter [employer] of Steps: 3 Entrance Stairs-Rails: Right Home Layout: One level     Bathroom Shower/Tub: Tub/shower unit Shower/tub characteristics: Architectural technologist: Standard Bathroom Accessibility: Yes How Accessible: Accessible via walker Home Equipment: Tub bench (borrowed from sister )          Prior Functioning/Environment Level of Independence: Independent             OT Diagnosis: Acute pain   OT Problem List: Impaired balance (sitting and/or standing);Decreased knowledge of use of DME or AE;Decreased knowledge of precautions;Pain   OT Treatment/Interventions: Self-care/ADL training;DME and/or AE instruction;Patient/family education    OT Goals(Current goals can be found in the care plan section) Acute Rehab OT Goals Patient Stated Goal: to go home tomorrow OT Goal Formulation: With patient/family Time For Goal Achievement: 07/09/15 Potential to Achieve Goals: Good ADL Goals Pt Will Perform Tub/Shower Transfer: Tub transfer;with supervision;ambulating;rolling walker (tub bench vs. 3 in 1)  OT Frequency: Min 2X/week   Barriers to D/C:            Co-evaluation              End of Session Equipment Utilized During Treatment: Gait belt;Rolling walker CPM Left Knee CPM Left Knee: Off  Activity Tolerance: Patient tolerated treatment well Patient left: in chair;with call bell/phone within reach;with  family/visitor present (zero degree bone foam applied to LLE)   Time: [id number] OT Time Calculation (min): 24 min Charges:  OT General Charges $OT Visit: 1 Procedure OT Evaluation $Initial OT Evaluation Tier I: 1 Procedure OT Treatments $Self Care/Home Management : 8-22 mins G-Codes:     Binnie Kand M.S., OTR/L Pager: [phone]  06/25/2015, 10:18 AM

## 2015-06-25 NOTE — Care Management Note (Signed)
Case Management Note  Patient Details  Name: Kristina Richard MRN: [id number] Date of Birth: [birth year]  Subjective/Objective:        S/p left total knee arthroplasty            Action/Plan: Set up with [facility] for HHPT by MD office. Spoke with patient and her sister, no change in discharge plan. Patient will be staying with her sister after discharge. [employer] delivered rolling walker to patient's room and will deliver 3N1 to room in the am. [employer] will deliver CPM to home. Will continue to follow until discharge.      Expected Discharge Date:                  Expected Discharge Plan:  [place]  In-House Referral:  NA  Discharge planning Services  CM Consult  Post Acute Care Choice:  Home Health, Durable Medical Equipment Choice offered to:  Patient  DME Arranged:  3-N-1, CPM, Walker rolling DME Agency:  [employer]  HH Arranged:  PT HH Agency:  [facility]  Status of Service:  Completed, signed off  Medicare Important Message Given:    Date Medicare IM Given:    Medicare IM give by:    Date Additional Medicare IM Given:    Additional Medicare Important Message give by:     If discussed at [place] of Stay Meetings, dates discussed:    Additional Comments:  Nila Nephew, RN 06/25/2015, 4:16 PM

## 2015-06-25 NOTE — Anesthesia Postprocedure Evaluation (Signed)
Anesthesia Post Note  Patient: Kristina Richard  Procedure(s) Performed: Procedure(s) (LRB): TOTAL KNEE ARTHROPLASTY (Left)  Patient location during evaluation: PACU Anesthesia Type: Spinal Level of consciousness: awake and alert Pain management: pain level controlled Vital Signs Assessment: post-procedure vital signs reviewed and stable Respiratory status: spontaneous breathing and respiratory function stable Cardiovascular status: blood pressure returned to baseline and stable Postop Assessment: no signs of nausea or vomiting Anesthetic complications: no    Last Vitals:  Filed Vitals:   06/25/15 0040 06/25/15 0604  BP: 133/63 137/82  Pulse: 71 73  Temp: 36.6 C 36.6 C  Resp: 16 16    Last Pain:  Filed Vitals:   06/25/15 0822  PainSc: 4                  Milagro Belmares S

## 2015-06-26 LAB — CBC
HCT: 33.6 % — ABNORMAL LOW (ref 36.0–46.0)
HEMOGLOBIN: 10.6 g/dL — AB (ref 12.0–15.0)
MCH: 27.4 pg (ref 26.0–34.0)
MCHC: 31.5 g/dL (ref 30.0–36.0)
MCV: 86.8 fL (ref 78.0–100.0)
Platelets: 282 10*3/uL (ref 150–400)
RBC: 3.87 MIL/uL (ref 3.87–5.11)
RDW: 12.9 % (ref 11.5–15.5)
WBC: 14.7 10*3/uL — ABNORMAL HIGH (ref 4.0–10.5)

## 2015-06-26 LAB — BASIC METABOLIC PANEL
Anion gap: 6 (ref 5–15)
BUN: 9 mg/dL (ref 6–20)
CALCIUM: 8.6 mg/dL — AB (ref 8.9–10.3)
CHLORIDE: 101 mmol/L (ref 101–111)
CO2: 30 mmol/L (ref 22–32)
CREATININE: 0.75 mg/dL (ref 0.44–1.00)
GFR calc Af Amer: >60 mL/min (ref >60)
GFR calc non Af Amer: >60 mL/min (ref >60)
Glucose, Bld: 109 mg/dL — ABNORMAL HIGH (ref 65–99)
Potassium: 4 mmol/L (ref 3.5–5.1)
SODIUM: 137 mmol/L (ref 135–145)

## 2015-06-26 MED ORDER — DOCUSATE SODIUM 100 MG PO CAPS: ORAL_CAPSULE | ORAL | Status: DC

## 2015-06-26 MED ORDER — OXYCODONE HCL 5 MG PO TABS: ORAL_TABLET | ORAL | Status: DC

## 2015-06-26 MED ORDER — APIXABAN 2.5 MG PO TABS: ORAL_TABLET | ORAL | Status: DC

## 2015-06-26 MED ORDER — ONDANSETRON HCL 4 MG PO TABS: 4.0000 mg | ORAL_TABLET | Freq: Four times a day (QID) | ORAL | Status: DC | PRN

## 2015-06-26 MED ORDER — POLYETHYLENE GLYCOL 3350 17 G PO PACK: PACK | ORAL | Status: DC

## 2015-06-26 NOTE — Progress Notes (Signed)
Occupational Therapy Treatment and Discharge   Patient Details Name: Latoyna Hird MRN: [id number] DOB: 1940-10-30 Today's Date: 06/26/2015    History of present illness Admitted for L TKA; WBAT   OT comments  Pt making great progress toward OT goal; adequate for d/c from skilled OT. Educated pt on tub transfer with 3 in 1; pt able to return demonstrate with min guard assist to guide LLE in and out of tub. All education has been completed; pt and sister with no further questions or concerns for OT. Pt ready for d/c home from OT standpoint; pt d/c from skilled OT services at this time.    Follow Up Recommendations  No OT follow up;Supervision/Assistance - 24 hour    Equipment Recommendations  3 in 1 bedside comode    Recommendations for Other Services      Precautions / Restrictions Precautions Precautions: Knee Restrictions Weight Bearing Restrictions: Yes LLE Weight Bearing: Weight bearing as tolerated       Mobility Bed Mobility               General bed mobility comments: Pt OOB in chair  Transfers Overall transfer level: Needs assistance Equipment used: Rolling walker (2 wheeled) Transfers: Sit to/from Stand Sit to Stand: Supervision         General transfer comment: Supervision for safety. Good hand placement and technique    Balance Overall balance assessment: Needs assistance         Standing balance support: No upper extremity supported;During functional activity Standing balance-Leahy Scale: Fair Standing balance comment: Able to stand at sink and wash hands without UE support                   ADL Overall ADL's : Needs assistance/impaired     Grooming: Supervision/safety;Standing;Wash/dry hands                   Toilet Transfer: Supervision/safety;Ambulation;BSC;RW (BSC over toilet)   Toileting- Clothing Manipulation and Hygiene: Supervision/safety;Sit to/from stand   Tub/ Shower Transfer: Min guard;Ambulation;3 in  1;Rolling walker Tub/Shower Transfer Details (indicate cue type and reason): Min guard to guide LLE in and out of tub. Educated on tub transfer technique using a 3 in 1 and provided with handout; pt able to return demonstrate understanding. Functional mobility during ADLs: Supervision/safety;Rolling walker General ADL Comments: Pts sister present for OT eval. Reviewed home safety, need for supervision during ADLs and mobility, ice for edema and pain; pt and sister verbalize understanding.       Vision                     Perception     Praxis      Cognition   Behavior During Therapy: WFL for tasks assessed/performed Overall Cognitive Status: Within Functional Limits for tasks assessed                       Extremity/Trunk Assessment               Exercises     Shoulder Instructions       General Comments      Pertinent Vitals/ Pain       Pain Assessment: 0-10 Pain Score: 5  Pain Location: L knee Pain Descriptors / Indicators: Aching Pain Intervention(s): Limited activity within patient's tolerance;Monitored during session;Premedicated before session;Ice applied  Home Living  Prior Functioning/Environment              Frequency       Progress Toward Goals  OT Goals(current goals can now be found in the care plan section)  Progress towards OT goals: Goals met/education completed, patient discharged from OT  Acute Rehab OT Goals Patient Stated Goal: decrease pain and stiffness OT Goal Formulation: With patient/family  Plan All goals met and education completed, patient discharged from OT services    Co-evaluation                 End of Session Equipment Utilized During Treatment: Rolling walker   Activity Tolerance Patient tolerated treatment well   Patient Left in chair;with call bell/phone within reach;with family/visitor present   Nurse Communication Other  (comment) (pt ready for d/c from OT standpoint)        Time: 6568-1275 OT Time Calculation (min): 20 min  Charges: OT General Charges $OT Visit: 1 Procedure OT Treatments $Self Care/Home Management : 8-22 mins  Binnie Kand M.S., OTR/L Pager: [phone]  06/26/2015, 12:05 PM

## 2015-06-26 NOTE — Progress Notes (Signed)
Physical Therapy Treatment Patient Details Name: Kristina Richard MRN: [id number] DOB: 10/18/1940 Today's Date: 06/26/2015    History of Present Illness Admitted for L TKA; WBAT    PT Comments    Doing very well with mobility and amb despite incr pain today; Approaching mod I; OK for dc home from PT standpoint   Follow Up Recommendations  Home health PT;Supervision/Assistance - 24 hour     Equipment Recommendations  Rolling walker with 5" wheels;3in1 (PT)    Recommendations for Other Services       Precautions / Restrictions Precautions Precautions: Knee Restrictions LLE Weight Bearing: Weight bearing as tolerated    Mobility  Bed Mobility                  Transfers Overall transfer level: Needs assistance Equipment used: Rolling walker (2 wheeled) Transfers: Sit to/from Stand Sit to Stand: Supervision (aproaching Mod I)         General transfer comment: Supervision for safety. Good hand placement and technique.   Ambulation/Gait Ambulation/Gait assistance: Supervision (approaching Mod I) Ambulation Distance (Feet): 500 Feet Assistive device: Rolling walker (2 wheeled) Gait Pattern/deviations: Step-through pattern Gait velocity: approaching normal   General Gait Details: Cues to push foot and heel into floor with loading LLE into stance for better quad and gluteal contraction   Stairs            Wheelchair Mobility    Modified Rankin (Stroke Patients Only)       Balance                                    Cognition Arousal/Alertness: Awake/alert Behavior During Therapy: WFL for tasks assessed/performed Overall Cognitive Status: Within Functional Limits for tasks assessed                      Exercises      General Comments        Pertinent Vitals/Pain Pain Assessment: 0-10 Pain Score: 6  Pain Location: L knee Pain Descriptors / Indicators: Aching Pain Intervention(s): Limited activity within  patient's tolerance;Monitored during session    Home Living                      Prior Function            PT Goals (current goals can now be found in the care plan section) Acute Rehab PT Goals Patient Stated Goal: less stiff; hopes to have no trouble with stairs at work PT Goal Formulation: With patient Time For Goal Achievement: 07/01/15 Potential to Achieve Goals: Good Progress towards PT goals: Progressing toward goals    Frequency  7X/week    PT Plan Current plan remains appropriate    Co-evaluation             End of Session   Activity Tolerance: Patient tolerated treatment well Patient left: in chair;with call bell/phone within reach;with family/visitor present     Time: [id number] PT Time Calculation (min) (ACUTE ONLY): 18 min  Charges:  $Gait Training: 8-22 mins                    G Codes:      Quin Hoop 06/26/2015, 9:11 AM  Roney Marion, [place] Pager [phone] Office [phone]

## 2015-06-26 NOTE — Discharge Summary (Signed)
Patient ID: Kristina Richard MRN: [id number] DOB/AGE: 1940-07-16 74 y.o.  Admit date: 06/24/2015 Discharge date: 06/26/2015  Admission Diagnoses:  Principal Problem:   Primary localized osteoarthritis of left knee Active Problems:   Bulging lumbar disc   Cervical disc disease   DJD (degenerative joint disease) of knee   Discharge Diagnoses:  Same  Past Medical History  Diagnosis Date  . Arthritis   . Primary localized osteoarthritis of left knee   . Insomnia     takes Xanax nightly as needed for insomnia or anxiety  . Pneumonia     hx of-most recent 5 yrs ago  . History of bronchitis 4 yrs ago  . Weakness     numbness and tingling in both   . Joint pain   . Joint swelling   . Back pain   . Rash     upper body and states that it nefver goes away.Saw a dermatologist  . Hemorrhoids   . History of colon polyps   . Nocturia   . Diverticulosis     Surgeries: Procedure(s): TOTAL KNEE ARTHROPLASTY on 06/24/2015   Consultants:    Discharged Condition: Improved  Hospital Course: Alenah Laplume is an 74 y.o. female who was admitted 06/24/2015 for operative treatment ofPrimary localized osteoarthritis of left knee. Patient has severe unremitting pain that affects sleep, daily activities, and work/hobbies. After pre-op clearance the patient was taken to the operating room on 06/24/2015 and underwent  Procedure(s): TOTAL KNEE ARTHROPLASTY.    Patient was given perioperative antibiotics: Anti-infectives    Start     Dose/Rate Route Frequency Ordered Stop   06/24/15 1330  ceFAZolin (ANCEF) IVPB 2 g/50 mL premix     2 g 100 mL/hr over 30 Minutes Intravenous Every 6 hours 06/24/15 1036 06/24/15 2042   06/24/15 0600  ceFAZolin (ANCEF) IVPB 2 g/50 mL premix     2 g 100 mL/hr over 30 Minutes Intravenous On call to O.R. 06/23/15 1843 06/24/15 0735       Patient was given sequential compression devices, early ambulation, and chemoprophylaxis to prevent DVT.  Patient  benefited maximally from hospital stay and there were no complications.    Recent vital signs: Patient Vitals for the past 24 hrs:  BP Temp Temp src Pulse Resp SpO2  06/26/15 0523 (!) 130/56 mmHg 98.3 F (36.8 C) Oral 73 16 97 %  06/25/15 2044 (!) 161/71 mmHg - - - - -  06/25/15 2039 (!) 170/85 mmHg 97.9 F (36.6 C) Oral 82 16 99 %  06/25/15 1426 (!) 160/72 mmHg (!) 87.9 F (31.1 C) - 84 16 95 %     Recent laboratory studies:  Recent Labs  06/25/15 0658 06/26/15 0540  WBC 18.6* 14.7*  HGB 11.4* 10.6*  HCT 34.2* 33.6*  PLT 321 282  NA 137 137  K 4.4 4.0  CL 104 101  CO2 25 30  BUN 9 9  CREATININE 0.68 0.75  GLUCOSE 143* 109*  CALCIUM 8.8* 8.6*     Discharge Medications:     Medication List    STOP taking these medications        traMADol 50 MG tablet  Commonly known as:  ULTRAM     vitamin E 1000 UNIT capsule      TAKE these medications        apixaban 2.5 MG Tabs tablet  Commonly known as:  ELIQUIS  1 tablet every 12 hrs to prevent blood clots     cholecalciferol 1000 UNITS tablet  Commonly known as:  VITAMIN D  Take 1,000 Units by mouth daily.     docusate sodium 100 MG capsule  Commonly known as:  COLACE  1 tab 2 times a day while on narcotics.  STOOL SOFTENER     fluticasone 50 MCG/ACT nasal spray  Commonly known as:  FLONASE  Place 2 sprays into both nostrils daily.     LORazepam 2 MG tablet  Commonly known as:  ATIVAN  Take 1 mg by mouth at bedtime as needed for anxiety or sleep.     Lysine 500 MG Tabs  Take 500 mg by mouth daily.     NIACIN PO  Take 400 mg by mouth daily.     ondansetron 4 MG tablet  Commonly known as:  ZOFRAN  Take 1 tablet (4 mg total) by mouth every 6 (six) hours as needed for nausea.     oxyCODONE 5 MG immediate release tablet  Commonly known as:  Oxy IR/ROXICODONE  1-2 tablets every 4-6 hrs as needed for pain     polyethylene glycol packet  Commonly known as:  MIRALAX / GLYCOLAX  17grams in 16 oz of water  twice a day until bowel movement.  LAXITIVE.  Restart if two days since last bowel movement        Diagnostic Studies: No results found.  Disposition: Final discharge disposition not confirmed      Discharge Instructions    CPM    Complete by:  As directed   Continuous passive motion machine (CPM):      Use the CPM from 0 to 90 for 6 hours per day.       You may break it up into 2 or 3 sessions per day.      Use CPM for 2 weeks or until you are told to stop.     Call MD / Call 911    Complete by:  As directed   If you experience chest pain or shortness of breath, CALL 911 and be transported to the hospital emergency room.  If you develope a fever above 101 F, pus (white drainage) or increased drainage or redness at the wound, or calf pain, call your surgeon's office.     Change dressing    Complete by:  As directed   Change the gauze dressing daily with sterile 4 x 4 inch gauze and apply TED hose.  DO NOT REMOVE BANDAGE OVER SURGICAL INCISION.  [place] WHOLE LEG INCLUDING OVER THE WATERPROOF BANDAGE WITH SOAP AND WATER EVERY DAY.     Constipation Prevention    Complete by:  As directed   Drink plenty of fluids.  Prune juice may be helpful.  You may use a stool softener, such as Colace (over the counter) 100 mg twice a day.  Use MiraLax (over the counter) for constipation as needed.     Diet - low sodium heart healthy    Complete by:  As directed      Discharge instructions    Complete by:  As directed   INSTRUCTIONS AFTER JOINT REPLACEMENT   Remove items at home which could result in a fall. This includes throw rugs or furniture in walking pathways ICE to the affected joint every three hours while awake for 30 minutes at a time, for at least the first 3-5 days, and then as needed for pain and swelling.  Continue to use ice for pain and swelling. You may notice swelling that will progress down to the foot  and ankle.  This is normal after surgery.  Elevate your leg when you are not up  walking on it.   Continue to use the breathing machine you got in the hospital (incentive spirometer) which will help keep your temperature down.  It is common for your temperature to cycle up and down following surgery, especially at night when you are not up moving around and exerting yourself.  The breathing machine keeps your lungs expanded and your temperature down.   DIET:  As you were doing prior to hospitalization, we recommend a well-balanced diet.  DRESSING / WOUND CARE / SHOWERING  Keep the surgical dressing until follow up.  The dressing is water proof, so you can shower without any extra covering.  IF THE DRESSING FALLS OFF or the wound gets wet inside, change the dressing with sterile gauze.  Please use good hand washing techniques before changing the dressing.  Do not use any lotions or creams on the incision until instructed by your surgeon.    ACTIVITY  Increase activity slowly as tolerated, but follow the weight bearing instructions below.   No driving for 6 weeks or until further direction given by your physician.  You cannot drive while taking narcotics.  No lifting or carrying greater than 10 lbs. until further directed by your surgeon. Avoid periods of inactivity such as sitting longer than an hour when not asleep. This helps prevent blood clots.  You may return to work once you are authorized by your doctor.     WEIGHT BEARING   Weight bearing as tolerated with assist device (walker, cane, etc) as directed, use it as long as suggested by your surgeon or therapist, typically at least 2-3 weeks.   EXERCISES  Results after joint replacement surgery are often greatly improved when you follow the exercise, range of motion and muscle strengthening exercises prescribed by your doctor. Safety measures are also important to protect the joint from further injury. Any time any of these exercises cause you to have increased pain or swelling, decrease what you are doing until you  are comfortable again and then slowly increase them. If you have problems or questions, call your caregiver or physical therapist for advice.   Rehabilitation is important following a joint replacement. After just a few days of immobilization, the muscles of the leg can become weakened and shrink (atrophy).  These exercises are designed to build up the tone and strength of the thigh and leg muscles and to improve motion. Often times heat used for twenty to thirty minutes before working out will loosen up your tissues and help with improving the range of motion but do not use heat for the first two weeks following surgery (sometimes heat can increase post-operative swelling).   These exercises can be done on a training (exercise) mat, on the floor, on a table or on a bed. Use whatever works the best and is most comfortable for you.    Use music or television while you are exercising so that the exercises are a pleasant break in your day. This will make your life better with the exercises acting as a break in your routine that you can look forward to.   Perform all exercises about fifteen times, three times per day or as directed.  You should exercise both the operative leg and the other leg as well.   Exercises include:   Quad Sets - Tighten up the muscle on the front of the thigh [employer]) and hold for  5-10 seconds.   Straight Leg Raises - With your knee straight (if you were given a brace, keep it on), lift the leg to 60 degrees, hold for 3 seconds, and slowly lower the leg.  Perform this exercise against resistance later as your leg gets stronger.  Leg Slides: Lying on your back, slowly slide your foot toward your buttocks, bending your knee up off the floor (only go as far as is comfortable). Then slowly slide your foot back down until your leg is flat on the floor again.  Angel Wings: Lying on your back spread your legs to the side as far apart as you can without causing discomfort.  Hamstring Strength:   Lying on your back, push your heel against the floor with your leg straight by tightening up the muscles of your buttocks.  Repeat, but this time bend your knee to a comfortable angle, and push your heel against the floor.  You may put a pillow under the heel to make it more comfortable if necessary.   A rehabilitation program following joint replacement surgery can speed recovery and prevent re-injury in the future due to weakened muscles. Contact your doctor or a physical therapist for more information on knee rehabilitation.    CONSTIPATION  Constipation is defined medically as fewer than three stools per week and severe constipation as less than one stool per week.  Even if you have a regular bowel pattern at home, your normal regimen is likely to be disrupted due to multiple reasons following surgery.  Combination of anesthesia, postoperative narcotics, change in appetite and fluid intake all can affect your bowels.   YOU MUST use at least one of the following options; they are listed in order of increasing strength to get the job done.  They are all available over the counter, and you may need to use some, POSSIBLY even all of these options:    Drink plenty of fluids (prune juice may be helpful) and high fiber foods Colace 100 mg by mouth twice a day  Senokot for constipation as directed and as needed Dulcolax (bisacodyl), take with full glass of water  Miralax (polyethylene glycol) once or twice a day as needed.  If you have tried all these things and are unable to have a bowel movement in the first 3-4 days after surgery call either your surgeon or your primary doctor.    If you experience loose stools or diarrhea, hold the medications until you stool forms back up.  If your symptoms do not get better within 1 week or if they get worse, check with your doctor.  If you experience "the worst abdominal pain ever" or develop nausea or vomiting, please contact the office immediately for further  recommendations for treatment.   ITCHING:  If you experience itching with your medications, try taking only a single pain pill, or even half a pain pill at a time.  You can also use Benadryl over the counter for itching or also to help with sleep.   TED HOSE STOCKINGS:  Use stockings on both legs until for at least 2 weeks or as directed by physician office. They may be removed at night for sleeping.  MEDICATIONS:  See your medication summary on the "After Visit Summary" that nursing will review with you.  You may have some home medications which will be placed on hold until you complete the course of blood thinner medication.  It is important for you to complete the blood thinner medication as  prescribed.  PRECAUTIONS:  If you experience chest pain or shortness of breath - call 911 immediately for transfer to the hospital emergency department.   If you develop a fever greater that 101 F, purulent drainage from wound, increased redness or drainage from wound, foul odor from the wound/dressing, or calf pain - CONTACT YOUR SURGEON.                                                   FOLLOW-UP APPOINTMENTS:  If you do not already have a post-op appointment, please call the office for an appointment to be seen by your surgeon.  Guidelines for how soon to be seen are listed in your "After Visit Summary", but are typically between 1-4 weeks after surgery.  OTHER INSTRUCTIONS:   Knee Replacement:  Do not place pillow under knee, focus on keeping the knee straight while resting. CPM instructions: 0-90 degrees, 2 hours in the morning, 2 hours in the afternoon, and 2 hours in the evening. Place foam block, curve side up under heel at all times except when in CPM or when walking.  DO NOT modify, tear, cut, or change the foam block in any way.  MAKE SURE YOU:  Understand these instructions.  Get help right away if you are not doing well or get worse.    Thank you for letting us be a part of your medical  care team.  It is a privilege we respect greatly.  We hope these instructions will help you stay on track for a fast and full recovery!     Do not put a pillow under the knee. Place it under the heel.    Complete by:  As directed   Place gray foam block, curve side up under heel at all times except when in CPM or when walking.  DO NOT modify, tear, cut, or change in any way the gray foam block.     Increase activity slowly as tolerated    Complete by:  As directed      TED hose    Complete by:  As directed   Use stockings (TED hose) for 2 weeks on both leg(s).  You may remove them at night for sleeping.           Follow-up Information    Follow up with [facility]   Why:  They will contact you to schedule home therapy visits.    Contact information:   [address] [place] [address] [phone]       Follow up with Lorn Junes, MD On 07/08/2015.   Specialty:  Orthopedic Surgery   Why:  appt time 2:15 pm   Contact information:   [place] [address] [phone]       Follow up with Lorn Junes, MD On 07/09/2015.   Specialty:  Orthopedic Surgery   Why:  Physical therapy appt at Dr Archie Endo office at 12:30pm   Contact information:   [place] [address] [phone]        Signed: Linda Hedges 06/26/2015, 1:30 PM

## 2015-08-02 ENCOUNTER — Encounter: Payer: Self-pay | Admitting: Family Medicine

## 2015-08-07 ENCOUNTER — Encounter: Payer: Self-pay | Admitting: Family Medicine

## 2015-09-26 ENCOUNTER — Ambulatory Visit (INDEPENDENT_AMBULATORY_CARE_PROVIDER_SITE_OTHER): Payer: Medicare Other | Admitting: Family Medicine

## 2015-09-26 ENCOUNTER — Encounter: Payer: Self-pay | Admitting: Family Medicine

## 2015-09-26 VITALS — BP 140/90 | HR 91 | Temp 97.9°F | Wt 153.0 lb

## 2015-09-26 DIAGNOSIS — I10 Essential (primary) hypertension: Secondary | ICD-10-CM | POA: Insufficient documentation

## 2015-09-26 DIAGNOSIS — Z23 Encounter for immunization: Secondary | ICD-10-CM

## 2015-09-26 DIAGNOSIS — G4700 Insomnia, unspecified: Secondary | ICD-10-CM | POA: Insufficient documentation

## 2015-09-26 DIAGNOSIS — S61200A Unspecified open wound of right index finger without damage to nail, initial encounter: Secondary | ICD-10-CM | POA: Diagnosis not present

## 2015-09-26 DIAGNOSIS — D62 Acute posthemorrhagic anemia: Secondary | ICD-10-CM

## 2015-09-26 DIAGNOSIS — K5900 Constipation, unspecified: Secondary | ICD-10-CM | POA: Diagnosis not present

## 2015-09-26 LAB — CBC
HCT: 44.2 % (ref 36.0–46.0)
HEMOGLOBIN: 14.6 g/dL (ref 12.0–15.0)
MCHC: 33 g/dL (ref 30.0–36.0)
MCV: 82.8 fl (ref 78.0–100.0)
PLATELETS: 381 10*3/uL (ref 150.0–400.0)
RBC: 5.34 Mil/uL — AB (ref 3.87–5.11)
RDW: 13.9 % (ref 11.5–15.5)
WBC: 9 10*3/uL (ref 4.0–10.5)

## 2015-09-26 MED ORDER — POLYETHYLENE GLYCOL 3350 17 G PO PACK: PACK | ORAL | Status: DC

## 2015-09-26 MED ORDER — LORAZEPAM 2 MG PO TABS: 1.0000 mg | ORAL_TABLET | Freq: Every evening | ORAL | Status: DC | PRN

## 2015-09-26 MED ORDER — LISINOPRIL 10 MG PO TABS: ORAL_TABLET | ORAL | Status: DC

## 2015-09-26 NOTE — Progress Notes (Signed)
Pre visit review using our clinic review tool, if applicable. No additional management support is needed unless otherwise documented below in the visit note. 

## 2015-09-26 NOTE — Patient Instructions (Signed)
Start on the lisinopril 10 mg- if your BP continues to run higher than 145/90 on a consistent basis go to 20 mg Please update me on mychart in a few weeks Let's plan to recheck here in 4-6 months For sleep you can use the lorazepam as needed- minimize use as this can be habit formin.    It was good to see you today!

## 2015-09-26 NOTE — Progress Notes (Signed)
[facility] [address], [place], [place] [address] [phone]  Date:  09/26/2015   Name:  Kristina Richard   DOB:  09-Nov-1940   MRN:  [id number]  PCP:  Lamar Blinks, MD    Chief Complaint: Follow-up   History of Present Illness:  Kristina Richard is a 75 y.o. very pleasant female patient who presents with the following:  Here today to recheck her BP- I saw her in November after her BP was high in pre-op for a knee operation.   She is not on any BP medications but does take ativan as needed for insomnia. She takes 1mg  maybe 3x a week- she has tried some other medications but this works the best for her  She had a total knee and is doing great.  She does take the occasional pain medication at this point, but overall she is doing much better.  She has traveled to [place] and had a good time  She did cut her right index finger while she was on her trip.  She is not sure of the date of her last tetanus shot  BP Readings from Last 3 Encounters:  09/26/15 160/80  06/26/15 130/56  06/12/15 160/100   She was told that her BP was high during her hospital stay.  She has never been treated for HTN in the past- her BP has always run low over her life.  There is a family history of HTN however  She does not generally have headaches- just occasionally.  No CP or SOB Never had any cardiac history   She does have some insomnia- she has used motion sickness pills OTC which help with sleep but left her hung over the next day Patient Active Problem List   Diagnosis Date Noted  . DJD (degenerative joint disease) of knee 06/24/2015  . Primary localized osteoarthritis of left knee   . Bulging lumbar disc   . Cervical disc disease     Past Medical History  Diagnosis Date  . Arthritis   . Primary localized osteoarthritis of left knee   . Insomnia     takes Xanax nightly as needed for insomnia or anxiety  . Pneumonia     hx  of-most recent 5 yrs ago  . History of bronchitis 4 yrs ago  . Weakness     numbness and tingling in both   . Joint pain   . Joint swelling   . Back pain   . Rash     upper body and states that it nefver goes away.Saw a dermatologist  . Hemorrhoids   . History of colon polyps   . Nocturia   . Diverticulosis     Past Surgical History  Procedure Laterality Date  . Tonsillectomy    . Appendectomy    . Colonoscopy    . Eye surgery Bilateral     cataract  . Total knee arthroplasty Left 06/24/2015    Procedure: TOTAL KNEE ARTHROPLASTY;  Surgeon: Elsie Saas, MD;  Location: [facility];  Service: Orthopedics;  Laterality: Left;    Social History  Substance Use Topics  . Smoking status: Never Smoker   . Smokeless tobacco: Never Used  . Alcohol Use: No    Family History  Problem Relation Age of Onset  . Cancer Mother   . Hyperlipidemia Father   . Hypertension Father   . Heart disease Brother   . Hypertension Maternal Grandmother  Allergies  Allergen Reactions  . Hydrocodone Other (See Comments)    Reports that if she gets to much she had trouble breathing and blacked out  . Morphine And Related     Doesn't want any morphine and sick to stomach  . Codeine Other (See Comments)    Can do codones like hydrocodone or oxycodone    Medication list has been reviewed and updated.  Current Outpatient Prescriptions on File Prior to Visit  Medication Sig Dispense Refill  . cholecalciferol (VITAMIN D) 1000 UNITS tablet Take 1,000 Units by mouth daily.    Marland Kitchen LORazepam (ATIVAN) 2 MG tablet Take 1 mg by mouth at bedtime as needed for anxiety or sleep.     Marland Kitchen Lysine 500 MG TABS Take 500 mg by mouth daily.     Marland Kitchen NIACIN PO Take 400 mg by mouth daily.    Marland Kitchen oxyCODONE (OXY IR/ROXICODONE) 5 MG immediate release tablet 1-2 tablets every 4-6 hrs as needed for pain 100 tablet 0  . polyethylene glycol (MIRALAX / GLYCOLAX) packet 17grams in 16 oz of water twice a day until bowel movement.   LAXITIVE.  Restart if two days since last bowel movement 60 each 0  . apixaban (ELIQUIS) 2.5 MG TABS tablet 1 tablet every 12 hrs to prevent blood clots (Patient not taking: Reported on 09/26/2015) 30 tablet 0  . docusate sodium (COLACE) 100 MG capsule 1 tab 2 times a day while on narcotics.  STOOL SOFTENER (Patient not taking: Reported on 09/26/2015) 60 capsule 0  . fluticasone (FLONASE) 50 MCG/ACT nasal spray Place 2 sprays into both nostrils daily. (Patient not taking: Reported on 09/26/2015) 16 g 6  . ondansetron (ZOFRAN) 4 MG tablet Take 1 tablet (4 mg total) by mouth every 6 (six) hours as needed for nausea. (Patient not taking: Reported on 09/26/2015) 20 tablet 0   No current facility-administered medications on file prior to visit.    Review of Systems:  As per HPI- otherwise negative.   Physical Examination: Filed Vitals:   09/26/15 1005  BP: 160/80  Pulse: 91  Temp: 97.9 F (36.6 C)   Filed Vitals:   09/26/15 1005  Weight: 153 lb (69.4 kg)   Body mass index is 27.52 kg/(m^2). Ideal Body Weight:    GEN: WDWN, NAD, Non-toxic, A & O x 3 HEENT: Atraumatic, Normocephalic. Neck supple. No masses, No LAD. Ears and Nose: No external deformity. CV: RRR, No M/G/R. No JVD. No thrill. No extra heart sounds. PULM: CTA B, no wheezes, crackles, rhonchi. No retractions. No resp. distress. No accessory muscle use. ABD: S, NT, ND, +BS. No rebound. No HSM. EXTR: No c/c/e NEURO Normal gait.  PSYCH: Normally interactive. Conversant. Not depressed or anxious appearing.  Calm demeanor.  Right index finger: small wound that is closed, healing and does not appear infected   Assessment and Plan: Essential hypertension - Plan: lisinopril (PRINIVIL,ZESTRIL) 10 MG tablet  Insomnia - Plan: LORazepam (ATIVAN) 2 MG tablet  Anemia associated with acute blood loss - Plan: CBC  Constipation, unspecified constipation type - Plan: polyethylene glycol (MIRALAX / GLYCOLAX) packet  Open wound of right  index finger without damage to nail, initial encounter - Plan: Td vaccine greater than or equal to 7yo preservative free IM   Will start on lisinopril 10 mg for her HTN- she will monitor at home and keep me posted, increase to 20 mg if needed Will see how her CBC has recovered since her operation Refilled her ativan to use  as needed for insomnia Meds ordered this encounter  Medications  . lisinopril (PRINIVIL,ZESTRIL) 10 MG tablet    Sig: If BP over 145/90 increase to 2 pills daily    Dispense:  60 tablet    Refill:  5  . LORazepam (ATIVAN) 2 MG tablet    Sig: Take 0.5 tablets (1 mg total) by mouth at bedtime as needed for anxiety or sleep.    Dispense:  30 tablet    Refill:  1  . polyethylene glycol (MIRALAX / GLYCOLAX) packet    Sig: 17grams in 16 oz of water twice a day until bowel movement.  LAXITIVE.  Restart if two days since last bowel movement    Dispense:  60 each    Refill:  1  . MAGNESIUM CITRATE PO    Sig: Take 150 mg by mouth 2 (two) times daily.    Signed Lamar Blinks, MD

## 2015-10-08 ENCOUNTER — Telehealth: Payer: Self-pay | Admitting: Family Medicine

## 2015-10-08 DIAGNOSIS — G4700 Insomnia, unspecified: Secondary | ICD-10-CM

## 2015-10-08 NOTE — Telephone Encounter (Signed)
Caller name: Self  Can be reached: [phone]  Pharmacy:  [id number] - [place], [place] - [place] [phone] (Phone) [phone] (Fax)         Reason for call: Patient request call back about rx for LORazepam (ATIVAN) 2 MG tablet [id number] . States she does not have enough to last her a month. States she is taking 2 mgs per day.

## 2015-10-09 MED ORDER — LORAZEPAM 2 MG PO TABS: ORAL_TABLET | ORAL | Status: DC

## 2015-10-09 NOTE — Telephone Encounter (Signed)
Called her to check in.  She is using 1/2 a lorazepam as needed for sleep but is also taking 1/2 during the day for daytime anxiety related to her sister's illness.  She got #15 ativan at her last fill instead of 30 Will change her sig to 1/2 BID as needed so she can get #30 per month

## 2015-11-26 NOTE — Telephone Encounter (Signed)
Pt called back in because she says that she need 2 mg each night and a 30 day supply. She says that she is having trouble sleeping.

## 2015-11-26 NOTE — Telephone Encounter (Signed)
Spoke with pt. She never picked up rx for 3/29 (take 1/2 tablet twice daily). Pt is requesting to take 1 whole tablet at bedtime. States she does not need the 1/2 during the day. Reports that while taking only a 1/2 tablet at bedtime she has trouble falling and staying asleep.  Please advise.

## 2015-11-27 MED ORDER — LORAZEPAM 2 MG PO TABS: ORAL_TABLET | ORAL | Status: DC

## 2015-11-27 NOTE — Addendum Note (Signed)
Addended by: Lamar Blinks C on: 11/27/2015 05:19 PM   Modules accepted: Orders

## 2015-11-27 NOTE — Telephone Encounter (Signed)
Took care of this for her.  Called to check in- she denies any alcohol use, does not feel overly sedated with ativan She will let me know if any problems

## 2016-02-17 ENCOUNTER — Other Ambulatory Visit: Payer: Self-pay | Admitting: Family Medicine

## 2016-02-17 DIAGNOSIS — G4700 Insomnia, unspecified: Secondary | ICD-10-CM

## 2016-02-17 MED ORDER — LORAZEPAM 2 MG PO TABS: ORAL_TABLET | ORAL | 1 refills | Status: DC

## 2016-02-17 NOTE — Telephone Encounter (Signed)
°  Relationship to patient: Self  Can be reached: [phone]  Pharmacy:  Reason for call: Request refill on LORazepam (ATIVAN) 2 MG tablet [id number]

## 2016-04-02 ENCOUNTER — Encounter: Payer: Self-pay | Admitting: Family Medicine

## 2016-04-02 ENCOUNTER — Ambulatory Visit: Payer: Medicare Other | Admitting: Family Medicine

## 2016-04-02 ENCOUNTER — Ambulatory Visit (INDEPENDENT_AMBULATORY_CARE_PROVIDER_SITE_OTHER): Payer: Medicare Other | Admitting: Family Medicine

## 2016-04-02 VITALS — BP 128/67 | HR 68 | Ht 62.5 in | Wt 144.6 lb

## 2016-04-02 DIAGNOSIS — Z13220 Encounter for screening for lipoid disorders: Secondary | ICD-10-CM | POA: Diagnosis not present

## 2016-04-02 DIAGNOSIS — Z1329 Encounter for screening for other suspected endocrine disorder: Secondary | ICD-10-CM

## 2016-04-02 DIAGNOSIS — G4700 Insomnia, unspecified: Secondary | ICD-10-CM

## 2016-04-02 DIAGNOSIS — Z130 Encounter for screening for diseases of the blood and blood-forming organs and certain disorders involving the immune mechanism: Secondary | ICD-10-CM | POA: Diagnosis not present

## 2016-04-02 DIAGNOSIS — Z131 Encounter for screening for diabetes mellitus: Secondary | ICD-10-CM

## 2016-04-02 DIAGNOSIS — I10 Essential (primary) hypertension: Secondary | ICD-10-CM | POA: Diagnosis not present

## 2016-04-02 MED ORDER — LISINOPRIL 10 MG PO TABS: 10.0000 mg | ORAL_TABLET | Freq: Every day | ORAL | 11 refills | Status: DC

## 2016-04-02 MED ORDER — TRAZODONE HCL 50 MG PO TABS: 25.0000 mg | ORAL_TABLET | Freq: Every evening | ORAL | 3 refills | Status: DC | PRN

## 2016-04-02 NOTE — Patient Instructions (Addendum)
Your blood pressure looks fine- continue to take 10 mg of lisinopril daily We will check labs for you today and I'll be in touch with your results asap  We will ask your GYN office for your bone density report for your chart  Take a 1/2 lorazepam at bedtime for 4 days, then if possible take just a quarter for a couple of days. Then stop the lorazepam and start on the trazodone for sleep. I think trazodone would be safer for you to use for the long term. However, if it not effective we can go back to lorazepam. Please keep me posted

## 2016-04-02 NOTE — Progress Notes (Signed)
[facility] [address] [place], [place] [address] [phone] [id number] [phone]  Date:  04/02/2016   Name:  Kristina Richard   DOB:  [birth year]   MRN:  [id number]  PCP:  Lamar Blinks, MD    Chief Complaint: Follow-up (Pt here for 4-6 month f/u. Will need refill. Pt declined flu vaccine. )   History of Present Illness:  Kristina Richard is a 75 y.o. very pleasant female patient who presents with the following:  I last saw her in March- at that time we started  Declines flu shot today She is taking 10 mg of lisinopril now for her BP.  She is tolerating this well She also had a bone density test at her OBG office in August- [facility].   She went thorough menopause about 20 years ago  She has suffered with insomnia for years   She does take melatonin, and will use her lorazepam also for insomnia- she is taking this 4+ nights out of the week. This does help her sleep but she worries about being on this medication long term. She would like to try something that is not habit forming  Even if she wakes up she is able to get back to sleep with lorazepam.  As long as she sleeps 7 hours or so she wakes up feeling refreshed   She has gone back to work recently- her job called her to come in and help again recently.  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 She is also doing water aerobics which she likes very much.  She lives with her sister who has MS, helps to care for her  She has not eaten since breakfast this morning Reviewed [facility]- she last filled lorazepam 2mg , #30 on 9/8 No unexpected entries on record    Patient Active Problem List   Diagnosis Date Noted  . Essential hypertension 09/26/2015  . Insomnia 09/26/2015  . DJD (degenerative joint disease) of knee 06/24/2015  . Primary localized osteoarthritis of left knee   . Bulging lumbar disc   . Cervical disc disease     Past Medical History:  Diagnosis Date  . Arthritis   .  Back pain   . Diverticulosis   . Hemorrhoids   . History of bronchitis 4 yrs ago  . History of colon polyps   . Insomnia    takes Xanax nightly as needed for insomnia or anxiety  . Joint pain   . Joint swelling   . Nocturia   . Pneumonia    hx of-most recent 5 yrs ago  . Primary localized osteoarthritis of left knee   . Rash    upper body and states that it nefver goes away.Saw a dermatologist  . Weakness    numbness and tingling in both     Past Surgical History:  Procedure Laterality Date  . APPENDECTOMY    . COLONOSCOPY    . EYE SURGERY Bilateral    cataract  . TONSILLECTOMY    . TOTAL KNEE ARTHROPLASTY Left 06/24/2015   Procedure: TOTAL KNEE ARTHROPLASTY;  Surgeon: Elsie Saas, MD;  Location: [facility];  Service: Orthopedics;  Laterality: Left;    Social History  Substance Use Topics  . Smoking status: Never Smoker  . Smokeless tobacco: Never Used  . Alcohol use No    Family History  Problem Relation Age of Onset  . Cancer Mother   . Hyperlipidemia Father   . Hypertension Father   .  Heart disease Brother   . Hypertension Maternal Grandmother     Allergies  Allergen Reactions  . Hydrocodone Other (See Comments)    Reports that if she gets to much she had trouble breathing and blacked out  . Morphine And Related     Doesn't want any morphine and sick to stomach  . Codeine Other (See Comments)    Can do codones like hydrocodone or oxycodone    Medication list has been reviewed and updated.  Current Outpatient Prescriptions on File Prior to Visit  Medication Sig Dispense Refill  . cholecalciferol (VITAMIN D) 1000 UNITS tablet Take 1,000 Units by mouth daily.    Marland Kitchen lisinopril (PRINIVIL,ZESTRIL) 10 MG tablet If BP over 145/90 increase to 2 pills daily (Patient taking differently: Take 10 mg by mouth daily. If BP over 145/90 increase to 2 pills daily) 60 tablet 5  . LORazepam (ATIVAN) 2 MG tablet Take 1 at bedtime as needed for insomnia 30 tablet 1  . Lysine  500 MG TABS Take 500 mg by mouth daily.     Marland Kitchen MAGNESIUM CITRATE PO Take 150 mg by mouth 2 (two) times daily.    Marland Kitchen NIACIN PO Take 400 mg by mouth daily.     No current facility-administered medications on file prior to visit.     Review of Systems:  As per HPI- otherwise negative.   Physical Examination: Vitals:   04/02/16 1501 04/02/16 1507  BP: (!) 143/86 128/67  Pulse: 68    Vitals:   04/02/16 1501  Weight: 144 lb 9.6 oz (65.6 kg)  Height: 5' 2.5" (1.588 m)   Body mass index is 26.03 kg/m. Ideal Body Weight: Weight in (lb) to have BMI = 25: 138.6  GEN: WDWN, NAD, Non-toxic, A & O x 3, looks well today HEENT: Atraumatic, Normocephalic. Neck supple. No masses, No LAD.  Bilateral TM wnl, oropharynx normal.  PEERL,EOMI.   Ears and Nose: No external deformity. CV: RRR, No M/G/R. No JVD. No thrill. No extra heart sounds. PULM: CTA B, no wheezes, crackles, rhonchi. No retractions. No resp. distress. No accessory muscle use. ABD: S, NT, ND, +BS. No rebound. No HSM. EXTR: No c/c/e NEURO Normal gait.  PSYCH: Normally interactive. Conversant. Not depressed or anxious appearing.  Calm demeanor.    Assessment and Plan: Insomnia - Plan: traZODone (DESYREL) 50 MG tablet  Essential hypertension - Plan: lisinopril (PRINIVIL,ZESTRIL) 10 MG tablet  Screening for deficiency anemia - Plan: CBC  Screening for diabetes mellitus - Plan: Comprehensive metabolic panel  Screening for hyperlipidemia - Plan: Lipid panel  Screening for thyroid disorder - Plan: TSH  Continue current BP medication- refilled for her today Will try and change from lorazepam to trazodone for better safety profile- discussed how to taper the lorazepam. She will try this. If not successful she will let me know Will plan further follow- up pending labs. Plan to follow-up here in 4-6 months   Signed Lamar Blinks, MD

## 2016-04-03 LAB — COMPREHENSIVE METABOLIC PANEL
ALK PHOS: 49 U/L (ref 39–117)
ALT: 10 U/L (ref 0–35)
AST: 13 U/L (ref 0–37)
Albumin: 3.9 g/dL (ref 3.5–5.2)
BILIRUBIN TOTAL: 0.8 mg/dL (ref 0.2–1.2)
BUN: 12 mg/dL (ref 6–23)
CALCIUM: 9.4 mg/dL (ref 8.4–10.5)
CO2: 32 mEq/L (ref 19–32)
Chloride: 102 mEq/L (ref 96–112)
Creatinine, Ser: 0.74 mg/dL (ref 0.40–1.20)
GFR: 81.66 mL/min (ref >60.00)
Glucose, Bld: 86 mg/dL (ref 70–99)
Potassium: 4.4 mEq/L (ref 3.5–5.1)
Sodium: 140 mEq/L (ref 135–145)
TOTAL PROTEIN: 6.8 g/dL (ref 6.0–8.3)

## 2016-04-03 LAB — CBC
HCT: 40.3 % (ref 36.0–46.0)
HEMOGLOBIN: 13.7 g/dL (ref 12.0–15.0)
MCHC: 34 g/dL (ref 30.0–36.0)
MCV: 83.4 fl (ref 78.0–100.0)
PLATELETS: 342 10*3/uL (ref 150.0–400.0)
RBC: 4.83 Mil/uL (ref 3.87–5.11)
RDW: 13.3 % (ref 11.5–15.5)
WBC: 7.7 10*3/uL (ref 4.0–10.5)

## 2016-04-03 LAB — LIPID PANEL
Cholesterol: 192 mg/dL (ref 0–200)
HDL: 50.5 mg/dL (ref >39.00)
LDL Cholesterol: 123 mg/dL — ABNORMAL HIGH (ref 0–99)
NONHDL: 141.78
TRIGLYCERIDES: 93 mg/dL (ref 0.0–149.0)
Total CHOL/HDL Ratio: 4
VLDL: 18.6 mg/dL (ref 0.0–40.0)

## 2016-04-03 LAB — TSH: TSH: 1.03 u[IU]/mL (ref 0.35–4.50)

## 2016-04-14 ENCOUNTER — Telehealth: Payer: Self-pay | Admitting: Emergency Medicine

## 2016-04-14 NOTE — Telephone Encounter (Signed)
Called [facility] to request bone density report. Spoke to medical records and was advised that bone density report would be faxed.

## 2016-04-14 NOTE — Telephone Encounter (Signed)
-----   Message from Darreld Mclean, MD sent at 04/02/2016  3:22 PM EDT ----- Please give [facility] a call and see if they will fax [place] her bone density report

## 2016-04-15 ENCOUNTER — Encounter: Payer: Self-pay | Admitting: Family Medicine

## 2016-04-15 DIAGNOSIS — M8580 Other specified disorders of bone density and structure, unspecified site: Secondary | ICD-10-CM | POA: Insufficient documentation

## 2016-06-22 ENCOUNTER — Other Ambulatory Visit: Payer: Self-pay | Admitting: Emergency Medicine

## 2016-06-22 ENCOUNTER — Encounter: Payer: Self-pay | Admitting: Family Medicine

## 2016-06-22 ENCOUNTER — Telehealth: Payer: Self-pay | Admitting: Family Medicine

## 2016-06-22 DIAGNOSIS — G4700 Insomnia, unspecified: Secondary | ICD-10-CM

## 2016-06-22 NOTE — Telephone Encounter (Signed)
Caller name: Relationship to patient: Self Can be reached: [phone]  Pharmacy:  [facility] [place], [place] [phone] (Phone) [phone] (Fax)     Reason for call: Refill LORazepam (ATIVAN) 2 MG tablet [id number]

## 2016-06-23 ENCOUNTER — Other Ambulatory Visit: Payer: Self-pay | Admitting: Emergency Medicine

## 2016-06-23 DIAGNOSIS — G4700 Insomnia, unspecified: Secondary | ICD-10-CM

## 2016-06-23 MED ORDER — LORAZEPAM 2 MG PO TABS: ORAL_TABLET | ORAL | 2 refills | Status: DC

## 2016-06-23 NOTE — Telephone Encounter (Signed)
Received refill request for LORazepam (ATIVAN) 2 MG tablet . Last office visit 04/02/16 and last refill 02/17/16. Is it ok to refill? Please advise.

## 2016-06-23 NOTE — Telephone Encounter (Signed)
Called her to clarify- she noted fatigue and felt "fuzzy" with trazodone, prefers the ativan.  Will RF ativan for her to use prn.

## 2016-11-24 ENCOUNTER — Telehealth: Payer: Self-pay

## 2016-11-24 ENCOUNTER — Encounter: Payer: Self-pay | Admitting: Family Medicine

## 2016-11-24 LAB — HM PAP SMEAR

## 2016-11-24 NOTE — Telephone Encounter (Signed)
Pre Visit call completed with patient.  

## 2016-11-25 ENCOUNTER — Encounter: Payer: Self-pay | Admitting: Family Medicine

## 2016-11-25 ENCOUNTER — Ambulatory Visit (INDEPENDENT_AMBULATORY_CARE_PROVIDER_SITE_OTHER): Payer: Medicare Other | Admitting: Family Medicine

## 2016-11-25 VITALS — BP 125/59 | HR 70 | Temp 98.7°F | Ht 62.5 in | Wt 149.0 lb

## 2016-11-25 DIAGNOSIS — I10 Essential (primary) hypertension: Secondary | ICD-10-CM | POA: Diagnosis not present

## 2016-11-25 DIAGNOSIS — Z0000 Encounter for general adult medical examination without abnormal findings: Secondary | ICD-10-CM | POA: Diagnosis not present

## 2016-11-25 DIAGNOSIS — G4700 Insomnia, unspecified: Secondary | ICD-10-CM

## 2016-11-25 MED ORDER — LORAZEPAM 2 MG PO TABS: ORAL_TABLET | ORAL | 3 refills | Status: DC

## 2016-11-25 NOTE — Patient Instructions (Addendum)
It was good to see you today- take care and continue to take good care of yourself!   Let's just stop the lisinopril- your blood pressure looks fine without it.   We will refill your lorazepam to use as needed  Please see me in September for your labs and to recheck your blood pressure

## 2016-11-25 NOTE — Progress Notes (Signed)
Subjective:   Kanika Bungert is a 76 y.o. female who presents for Medicare Annual (Subsequent) preventive examination.  Review of Systems:  She still has some trouble with sleep- she will use her lorazepam 3-4x a week as needed.  We tried trazodone but it did not really help her  NCCSR: nothing unexpected  She is on 10 mg of lisinopril.  Admits that she often forgets to take it- she may only take it once or twice a week.  We will stop this medication as her BP is fine and she is not really taking it anyway   She declines a pneumonia shot today- she does not wish to have these.    BP Readings from Last 3 Encounters:  11/25/16 (!) 125/59  04/02/16 128/67  09/26/15 140/90   colonoscopy 5/15 mammpo 8/17 dexa 8/17 Tetanus 3/17  She denies any CP, SOB, fever, chills, nausea, vomiting,diarrhea   she had never been a smoker     Objective:     Vitals: BP (!) 125/59 (BP Location: Left Arm, Patient Position: Sitting, Cuff Size: Normal)   Pulse 70   Temp 98.7 F (37.1 C) (Oral)   Ht 5' 2.5" (1.588 m)   Wt 149 lb (67.6 kg)   SpO2 99%   BMI 26.82 kg/m   Body mass index is 26.82 kg/m.   Tobacco History  Smoking Status  . Never Smoker  Smokeless Tobacco  . Never Used     Counseling given: No   Past Medical History:  Diagnosis Date  . Anemia   . Arthritis   . Back pain   . Cataract   . Diverticulosis   . Hemorrhoids   . History of bronchitis 4 yrs ago  . History of colon polyps   . Hypertension   . Insomnia    takes Xanax nightly as needed for insomnia or anxiety  . Joint pain   . Joint swelling   . Nocturia   . Pneumonia    hx of-most recent 5 yrs ago  . Primary localized osteoarthritis of left knee   . Rash    upper body and states that it nefver goes away.Saw a dermatologist  . Weakness    numbness and tingling in both    Past Surgical History:  Procedure Laterality Date  . APPENDECTOMY    . COLONOSCOPY    . EYE SURGERY Bilateral    cataract  .  TONSILLECTOMY    . TOTAL KNEE ARTHROPLASTY Left 06/24/2015   Procedure: TOTAL KNEE ARTHROPLASTY;  Surgeon: Elsie Saas, MD;  Location: [facility];  Service: Orthopedics;  Laterality: Left;   Family History  Problem Relation Age of Onset  . Cancer Mother   . Hyperlipidemia Father   . Hypertension Father   . Heart disease Brother   . Hypertension Maternal Grandmother    History  Sexual Activity  . Sexual activity: Not on file    Outpatient Encounter Prescriptions as of 11/25/2016  Medication Sig  . Biotin 2500 MCG CAPS Take 3,000 mcg by mouth.  . cholecalciferol (VITAMIN D) 1000 UNITS tablet Take 1,000 Units by mouth daily.  Marland Kitchen lisinopril (PRINIVIL,ZESTRIL) 10 MG tablet Take 1 tablet (10 mg total) by mouth daily.  Marland Kitchen LORazepam (ATIVAN) 2 MG tablet Take 1/2 or 1 at bedtime as needed for insomnia  . Lysine 500 MG TABS Take 500 mg by mouth daily.   Marland Kitchen MAGNESIUM CITRATE PO Take 150 mg by mouth 2 (two) times daily.  Marland Kitchen NIACIN PO Take  400 mg by mouth daily.   No facility-administered encounter medications on file as of 11/25/2016.     Activities of Daily Living In your present state of health, do you have any difficulty performing the following activities: 11/25/2016  Hearing? N  Vision? N  Difficulty concentrating or making decisions? N  Walking or climbing stairs? N  Dressing or bathing? N  Doing errands, shopping? N  Some recent data might be hidden    Patient Care Team: Miyuki Rzasa, Gay Filler, MD as PCP - General (Family Medicine)    Assessment:    Medicare annual wellness visit, subsequent  Insomnia, unspecified type - Plan: LORazepam (ATIVAN) 2 MG tablet  Essential hypertension   Exercise Activities and Dietary recommendations  continue water aerobics at least 3x a week and try to be active day to day as well  Goals    None     Fall Risk Fall Risk  11/25/2016 04/02/2016 09/26/2015 06/12/2015 12/21/2014  Falls in the past year? No No No No No   Depression Screen PHQ 2/9  Scores 11/25/2016 04/02/2016 09/26/2015 06/12/2015  PHQ - 2 Score 0 0 0 0  Exception Documentation - - Patient refusal -     Cognitive Function    pt does not have any concerns     Immunization History  Administered Date(s) Administered  . Td 09/26/2015   Screening Tests Health Maintenance  Topic Date Due  . INFLUENZA VACCINE  04/02/2017 (Originally 02/10/2017)  . PNA vac Low Risk Adult (1 of 2 - PCV13) 04/02/2017 (Originally 10/23/2007)  . MAMMOGRAM  02/17/2018  . COLONOSCOPY  11/25/2023  . TETANUS/TDAP  09/25/2025  . DEXA SCAN  Completed      Plan:    Medicare annual wellness visit, subsequent  Insomnia, unspecified type - Plan: LORazepam (ATIVAN) 2 MG tablet  Essential hypertension   BP looks fine without medication.  Will DC lisinopril Pt declines pneumonia shot.  Otherwise her health maint is UTD She will come back in the fall for labs Refilled her ativan which she uses prn for sleep  Meds ordered this encounter  Medications  . LORazepam (ATIVAN) 2 MG tablet    Sig: Take 1/2 or 1 at bedtime as needed for insomnia    Dispense:  30 tablet    Refill:  3    I have personally reviewed and noted the following in the patient's chart:   . Medical and social history . Use of alcohol, tobacco or illicit drugs  . Current medications and supplements . Functional ability and status . Nutritional status . Physical activity . Advanced directives . List of other physicians . Hospitalizations, surgeries, and ER visits in previous 12 months . Vitals . Screenings to include cognitive, depression, and falls . Referrals and appointments  In addition, I have reviewed and discussed with patient certain preventive protocols, quality metrics, and best practice recommendations. A written personalized care plan for preventive services as well as general preventive health recommendations were provided to patient.     Lamar Blinks, MD  11/25/2016

## 2016-11-25 NOTE — Progress Notes (Signed)
Rx for Ativan 2 mg called in to pharmacy per Dr. Lenna Sciara Copland.

## 2017-01-07 ENCOUNTER — Other Ambulatory Visit: Payer: Self-pay | Admitting: Emergency Medicine

## 2017-01-26 DIAGNOSIS — H26493 Other secondary cataract, bilateral: Secondary | ICD-10-CM | POA: Diagnosis not present

## 2017-01-26 DIAGNOSIS — H02834 Dermatochalasis of left upper eyelid: Secondary | ICD-10-CM | POA: Diagnosis not present

## 2017-01-26 DIAGNOSIS — H02831 Dermatochalasis of right upper eyelid: Secondary | ICD-10-CM | POA: Diagnosis not present

## 2017-01-26 DIAGNOSIS — H43813 Vitreous degeneration, bilateral: Secondary | ICD-10-CM | POA: Diagnosis not present

## 2017-03-04 DIAGNOSIS — H26492 Other secondary cataract, left eye: Secondary | ICD-10-CM | POA: Diagnosis not present

## 2017-03-19 NOTE — Progress Notes (Addendum)
[facility] [place] [facility] [address] [place], [place] [address] [phone]  Date:  03/22/2017   Name:  Kristina Richard   DOB:  June 19, 1941   MRN:  [id number]  PCP:  Darreld Mclean, MD    Chief Complaint: Follow-up (Pt here for follow visit and is fasting for labs. Declined flu and pneumonia vaccine)   History of Present Illness:  Kristina Richard is a 76 y.o. very pleasant female patient who presents with the following:  Last seen by myself in May of this year:  She still has some trouble with sleep- she will use her lorazepam 3-4x a week as needed.  We tried trazodone but it did not really help her  NCCSR: nothing unexpected  She is on 10 mg of lisinopril.  Admits that she often forgets to take it- she may only take it once or twice a week.  We will stop this medication as her BP is fine and she is not really taking it anyway   She declines a pneumonia shot today- she does not wish to have these.       BP Readings from Last 3 Encounters:  11/25/16 (!) 125/59  04/02/16 128/67  09/26/15 140/90   colonoscopy 5/15 mammpo 8/17 dexa 8/17 Tetanus 3/17  She denies any CP, SOB, fever, chills, nausea, vomiting,diarrhea  She had never been a smoker  Needs a recheck today Last labs about a year ago [place]: I gave her an rx for lorazepam with RF in May- she just had this filled in August, has one more RF remaining   She is not checking her BP meds We stopped her lisinopril at last visit as she was not really taking it anyway  She is fasting today- she did drink lots of water however She alternates ativan and trazodone at night for sleep and this works pretty well for her   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- she actually feels like she sleeps better there  She will schedule her mammo for this year   Wt Readings from Last 3 Encounters:  03/22/17 150 lb 6.4 oz (68.2 kg)   11/25/16 149 lb (67.6 kg)  04/02/16 144 lb 9.6 oz (65.6 kg)  she is also working some during the day; admistrative work, "sitting and stress eating"   She has noted some vague RLQ discomfort over the last several months She does still have her ovaries - would be interested in CA-125 testing today  She has noted a flesh colored, warty lesionon her right forehead for the last several weeks/ months  Declines flu shot and pneumonia shot today  Patient Active Problem List   Diagnosis Date Noted  . Osteopenia 04/15/2016  . Essential hypertension 09/26/2015  . Insomnia 09/26/2015  . DJD (degenerative joint disease) of knee 06/24/2015  . Primary localized osteoarthritis of left knee   . Bulging lumbar disc   . Cervical disc disease     Past Medical History:  Diagnosis Date  . Anemia   . Arthritis   . Back pain   . Cataract   . Diverticulosis   . Hemorrhoids   . History of bronchitis 4 yrs ago  . History of colon polyps   . Hypertension   . Insomnia    takes Xanax nightly as needed for insomnia or anxiety  . Joint pain   . Joint swelling   . Nocturia   .  Pneumonia    hx of-most recent 5 yrs ago  . Primary localized osteoarthritis of left knee   . Rash    upper body and states that it nefver goes away.Saw a dermatologist  . Weakness    numbness and tingling in both     Past Surgical History:  Procedure Laterality Date  . APPENDECTOMY    . COLONOSCOPY    . EYE SURGERY Bilateral    cataract  . TONSILLECTOMY    . TOTAL KNEE ARTHROPLASTY Left 06/24/2015   Procedure: TOTAL KNEE ARTHROPLASTY;  Surgeon: Elsie Saas, MD;  Location: [facility];  Service: Orthopedics;  Laterality: Left;    Social History  Substance Use Topics  . Smoking status: Never Smoker  . Smokeless tobacco: Never Used  . Alcohol use No    Family History  Problem Relation Age of Onset  . Cancer Mother   . Hyperlipidemia Father   . Hypertension Father   . Heart disease Brother   . Hypertension  Maternal Grandmother     Allergies  Allergen Reactions  . Hydrocodone Other (See Comments)    Reports that if she gets to much she had trouble breathing and blacked out  . Morphine And Related     Doesn't want any morphine and sick to stomach  . Codeine Other (See Comments)    Can do codones like hydrocodone or oxycodone    Medication list has been reviewed and updated.  Current Outpatient Prescriptions on File Prior to Visit  Medication Sig Dispense Refill  . Biotin 2500 MCG CAPS Take 3,000 mcg by mouth.    . cholecalciferol (VITAMIN D) 1000 UNITS tablet Take 1,000 Units by mouth daily.    Marland Kitchen LORazepam (ATIVAN) 2 MG tablet Take 1/2 or 1 at bedtime as needed for insomnia 30 tablet 3  . Lysine 500 MG TABS Take 500 mg by mouth daily.     Marland Kitchen MAGNESIUM CITRATE PO Take 150 mg by mouth 2 (two) times daily.    Marland Kitchen NIACIN PO Take 400 mg by mouth daily.     No current facility-administered medications on file prior to visit.     Review of Systems:  As per HPI- otherwise negative.  Physical Examination: Vitals:   03/22/17 0900  BP: (!) 152/84  Pulse: 71  Temp: 98.5 F (36.9 C)  SpO2: 98%   Vitals:   03/22/17 0900  Weight: 150 lb 6.4 oz (68.2 kg)   Body mass index is 27.07 kg/m. Ideal Body Weight:    GEN: WDWN, NAD, Non-toxic, A & O x 3, mild overweight, looks well HEENT: Atraumatic, Normocephalic. Neck supple. No masses, No LAD. Ears and Nose: No external deformity. CV: RRR, No M/G/R. No JVD. No thrill. No extra heart sounds. PULM: CTA B, no wheezes, crackles, rhonchi. No retractions. No resp. distress. No accessory muscle use. ABD: S, NT, ND, +BS. No rebound. No HSM. EXTR: No c/c/e NEURO Normal gait.  PSYCH: Normally interactive. Conversant. Not depressed or anxious appearing.  Calm demeanor.  Warty, flesh colored lesion on her right forehead, approx 4 mm diameter.     Assessment and Plan: Essential hypertension - Plan: lisinopril (PRINIVIL,ZESTRIL) 10 MG  tablet  Screening for deficiency anemia - Plan: CBC  Screening for diabetes mellitus - Plan: Comprehensive metabolic panel, Hemoglobin A1c  Screening for hyperlipidemia - Plan: Lipid panel  Right lower quadrant pain - Plan: CA 125  Here today for a recheck visit Labs pending as above Her BP has gone up- will start  her back on lisinopril 10 today She will come back for a skin lesion removal and BP recheck at her convenience   Signed Lamar Blinks, MD  9/11 Received her labs, message to pt cv risk 17%- will suggest a statin to pt  Results for orders placed or performed in visit on 03/22/17  CBC  Result Value Ref Range   WBC 7.0 4.0 - 10.5 K/uL   RBC 5.00 3.87 - 5.11 Mil/uL   Platelets 394.0 150.0 - 400.0 K/uL   Hemoglobin 14.3 12.0 - 15.0 g/dL   HCT 43.6 36.0 - 46.0 %   MCV 87.1 78.0 - 100.0 fl   MCHC 32.8 30.0 - 36.0 g/dL   RDW 12.8 11.5 - 15.5 %  Comprehensive metabolic panel  Result Value Ref Range   Sodium 139 135 - 145 mEq/L   Potassium 4.4 3.5 - 5.1 mEq/L   Chloride 101 96 - 112 mEq/L   CO2 31 19 - 32 mEq/L   Glucose, Bld 104 (H) 70 - 99 mg/dL   BUN 13 6 - 23 mg/dL   Creatinine, Ser 0.76 0.40 - 1.20 mg/dL   Total Bilirubin 1.3 (H) 0.2 - 1.2 mg/dL   Alkaline Phosphatase 55 39 - 117 U/L   AST 17 0 - 37 U/L   ALT 12 0 - 35 U/L   Total Protein 6.9 6.0 - 8.3 g/dL   Albumin 4.1 3.5 - 5.2 g/dL   Calcium 9.5 8.4 - 10.5 mg/dL   GFR 78.98 >60.00 mL/min  Lipid panel  Result Value Ref Range   Cholesterol 208 (H) 0 - 200 mg/dL   Triglycerides 66.0 0.0 - 149.0 mg/dL   HDL 54.70 >39.00 mg/dL   VLDL 13.2 0.0 - 40.0 mg/dL   LDL Cholesterol 140 (H) 0 - 99 mg/dL   Total CHOL/HDL Ratio 4    NonHDL 153.33   Hemoglobin A1c  Result Value Ref Range   Hgb A1c MFr Bld 5.8 4.6 - 6.5 %

## 2017-03-22 ENCOUNTER — Ambulatory Visit (INDEPENDENT_AMBULATORY_CARE_PROVIDER_SITE_OTHER): Payer: Medicare Other | Admitting: Family Medicine

## 2017-03-22 VITALS — BP 160/90 | HR 71 | Temp 98.5°F | Ht 62.5 in | Wt 150.4 lb

## 2017-03-22 DIAGNOSIS — I10 Essential (primary) hypertension: Secondary | ICD-10-CM

## 2017-03-22 DIAGNOSIS — Z130 Encounter for screening for diseases of the blood and blood-forming organs and certain disorders involving the immune mechanism: Secondary | ICD-10-CM | POA: Diagnosis not present

## 2017-03-22 DIAGNOSIS — Z13220 Encounter for screening for lipoid disorders: Secondary | ICD-10-CM | POA: Diagnosis not present

## 2017-03-22 DIAGNOSIS — Z131 Encounter for screening for diabetes mellitus: Secondary | ICD-10-CM

## 2017-03-22 DIAGNOSIS — R1031 Right lower quadrant pain: Secondary | ICD-10-CM | POA: Diagnosis not present

## 2017-03-22 LAB — LIPID PANEL
CHOL/HDL RATIO: 4
CHOLESTEROL: 208 mg/dL — AB (ref 0–200)
HDL: 54.7 mg/dL (ref >39.00)
LDL CALC: 140 mg/dL — AB (ref 0–99)
NonHDL: 153.33
TRIGLYCERIDES: 66 mg/dL (ref 0.0–149.0)
VLDL: 13.2 mg/dL (ref 0.0–40.0)

## 2017-03-22 LAB — COMPREHENSIVE METABOLIC PANEL
ALT: 12 U/L (ref 0–35)
AST: 17 U/L (ref 0–37)
Albumin: 4.1 g/dL (ref 3.5–5.2)
Alkaline Phosphatase: 55 U/L (ref 39–117)
BUN: 13 mg/dL (ref 6–23)
CO2: 31 meq/L (ref 19–32)
CREATININE: 0.76 mg/dL (ref 0.40–1.20)
Calcium: 9.5 mg/dL (ref 8.4–10.5)
Chloride: 101 mEq/L (ref 96–112)
GFR: 78.98 mL/min (ref >60.00)
Glucose, Bld: 104 mg/dL — ABNORMAL HIGH (ref 70–99)
POTASSIUM: 4.4 meq/L (ref 3.5–5.1)
SODIUM: 139 meq/L (ref 135–145)
Total Bilirubin: 1.3 mg/dL — ABNORMAL HIGH (ref 0.2–1.2)
Total Protein: 6.9 g/dL (ref 6.0–8.3)

## 2017-03-22 LAB — CBC
HEMATOCRIT: 43.6 % (ref 36.0–46.0)
Hemoglobin: 14.3 g/dL (ref 12.0–15.0)
MCHC: 32.8 g/dL (ref 30.0–36.0)
MCV: 87.1 fl (ref 78.0–100.0)
Platelets: 394 10*3/uL (ref 150.0–400.0)
RBC: 5 Mil/uL (ref 3.87–5.11)
RDW: 12.8 % (ref 11.5–15.5)
WBC: 7 10*3/uL (ref 4.0–10.5)

## 2017-03-22 LAB — HEMOGLOBIN A1C: HEMOGLOBIN A1C: 5.8 % (ref 4.6–6.5)

## 2017-03-22 MED ORDER — LISINOPRIL 10 MG PO TABS: 10.0000 mg | ORAL_TABLET | Freq: Every day | ORAL | 3 refills | Status: DC

## 2017-03-22 NOTE — Addendum Note (Signed)
Addended by: Harl Bowie on: 03/22/2017 09:34 AM   Modules accepted: Orders
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
## 2017-03-23 ENCOUNTER — Encounter: Payer: Self-pay | Admitting: Family Medicine

## 2017-03-23 LAB — CA 125: CA 125: 6 U/mL (ref <35)

## 2017-05-25 NOTE — Progress Notes (Addendum)
[place] at [facility] [address] [place], [place] [address] [phone]  Date:  05/26/2017   Name:  Kristina Richard   DOB:  29-Jul-1940   MRN:  [id number]  PCP:  Darreld Mclean, MD    Chief Complaint: No chief complaint on file.   History of Present Illness:  Kristina Richard is a 76 y.o. very pleasant female patient who presents with the following:  Follow-up visit today- BP recheck and skin lesion removal as per my last note  Flu: declines today  She is back on her lisinopril and it seems to be working well for her- her BP looks better today  BP Readings from Last 3 Encounters:  05/26/17 130/80  03/22/17 (!) 160/90  11/25/16 (!) 125/59   She has not noted any SE of the lisinopril   She has noted a warty skin lesion on her right forehead and would like me to remove this which is fine She is using either lorazepam OR trazodone as needed for insomnia.  She needs a refill of the lorazepam which I will provide for her today   [facility]: filled lorazepam on 8/20- nothing unexpected noted   Also she notes that she will sometimes have some "gallbladder pain" in her RUQ . This is not severe- it is more of a tenderness to the touch.  However she would like to get an [place] which is a reasonable idea  Patient Active Problem List   Diagnosis Date Noted  . Osteopenia 04/15/2016  . Essential hypertension 09/26/2015  . Insomnia 09/26/2015  . DJD (degenerative joint disease) of knee 06/24/2015  . Primary localized osteoarthritis of left knee   . Bulging lumbar disc   . Cervical disc disease     Past Medical History:  Diagnosis Date  . Anemia   . Arthritis   . Back pain   . Cataract   . Diverticulosis   . Hemorrhoids   . History of bronchitis 4 yrs ago  . History of colon polyps   . Hypertension   . Insomnia    takes Xanax nightly as needed for insomnia or anxiety  . Joint pain   . Joint swelling   . Nocturia   .  Pneumonia    hx of-most recent 5 yrs ago  . Primary localized osteoarthritis of left knee   . Rash    upper body and states that it nefver goes away.Saw a dermatologist  . Weakness    numbness and tingling in both     Past Surgical History:  Procedure Laterality Date  . APPENDECTOMY    . COLONOSCOPY    . EYE SURGERY Bilateral    cataract  . TONSILLECTOMY    . TOTAL KNEE ARTHROPLASTY Left 06/24/2015   Procedure: TOTAL KNEE ARTHROPLASTY;  Surgeon: Elsie Saas, MD;  Location: [facility];  Service: Orthopedics;  Laterality: Left;    Social History   Tobacco Use  . Smoking status: Never Smoker  . Smokeless tobacco: Never Used  Substance Use Topics  . Alcohol use: No    Alcohol/week: 0.0 oz  . Drug use: No    Family History  Problem Relation Age of Onset  . Cancer Mother   . Hyperlipidemia Father   . Hypertension Father   . Heart disease Brother   . Hypertension Maternal Grandmother     Allergies  Allergen Reactions  . Hydrocodone Other (See Comments)    Reports that if she  gets to much she had trouble breathing and blacked out  . Morphine And Related     Doesn't want any morphine and sick to stomach  . Codeine Other (See Comments)    Can do codones like hydrocodone or oxycodone    Medication list has been reviewed and updated.  Current Outpatient Medications on File Prior to Visit  Medication Sig Dispense Refill  . Biotin 2500 MCG CAPS Take 3,000 mcg by mouth.    . cholecalciferol (VITAMIN D) 1000 UNITS tablet Take 1,000 Units by mouth daily.    Marland Kitchen lisinopril (PRINIVIL,ZESTRIL) 10 MG tablet Take 1 tablet (10 mg total) by mouth daily. 90 tablet 3  . Lysine 500 MG TABS Take 500 mg by mouth daily.     Marland Kitchen MAGNESIUM CITRATE PO Take 150 mg by mouth 2 (two) times daily.    Marland Kitchen NIACIN PO Take 400 mg by mouth daily.    . traZODone (DESYREL) 50 MG tablet Take 1/2 to 1 tablet as needed.  3   No current facility-administered medications on file prior to visit.     Review of  Systems:  As per HPI- otherwise negative.   Physical Examination: Vitals:   05/26/17 1448 05/26/17 1504  BP: (!) 148/78 130/80  Pulse: 75   Temp: 97.6 F (36.4 C)   SpO2: 99%    Vitals:   05/26/17 1448  Weight: 153 lb (69.4 kg)   Body mass index is 27.54 kg/m. Ideal Body Weight:    GEN: WDWN, NAD, Non-toxic, A & O x 3, mild overweight, looks well HEENT: Atraumatic, Normocephalic. Neck supple. No masses, No LAD.  Bilateral TM wnl, oropharynx normal.  PEERL,EOMI.   Ears and Nose: No external deformity. CV: RRR, No M/G/R. No JVD. No thrill. No extra heart sounds. PULM: CTA B, no wheezes, crackles, rhonchi. No retractions. No resp. distress. No accessory muscle use. ABD: S, NT, ND, +BS. No rebound. No HSM. EXTR: No c/c/e NEURO Normal gait.  PSYCH: Normally interactive. Conversant. Not depressed or anxious appearing.  Calm demeanor.   VC obtained.  Prepped skin lesion (aprox 3 x 4 mm in size) on right temple with betadine and alcohol, anesthesia with a small wheal of 1% lido and epi. Removed entire skin lesion with dermablade, sent to path Silver nitrate for hemostasis and applied band-aid  Assessment and Plan: RUQ pain - Plan: US Abdomen Limited RUQ  Skin lesion of face - Plan: Dermatology pathology  Insomnia, unspecified type - Plan: LORazepam (ATIVAN) 2 MG tablet  Essential hypertension  Refilled her ativan- take this OR trazodone for sleep Removed skin lesion as above and discussed wound care  Ordered RUQ [place] for her BP looks ok today- continue current regimen - lisinopril 10 Signed Lamar Blinks, MD  Received her [place] report 11/18- message to pt  Your gallbladder looks good- no stones or other sign of gallbladder pathology.  It would appear that your gallbladder is not causing your periodic belly pain. If this continues to be an issue please let me know and we can look further  US Abdomen Limited Ruq  Result Date: 05/29/2017 CLINICAL DATA:  Right upper quadrant  tenderness for 2 weeks. EXAM: ULTRASOUND ABDOMEN LIMITED RIGHT UPPER QUADRANT COMPARISON:  None. FINDINGS: Gallbladder: No gallstones or wall thickening visualized. No sonographic Murphy sign noted by sonographer. Common bile duct: Diameter: 1 mm Liver: No focal lesion identified. Within normal limits in parenchymal echogenicity. Portal vein is patent on color Doppler imaging with normal direction of blood  flow towards the liver. IMPRESSION: No abnormalities identified. Electronically Signed   By: Dorise Bullion III M.D   On: 05/29/2017 10:59

## 2017-05-26 ENCOUNTER — Encounter: Payer: Self-pay | Admitting: Family Medicine

## 2017-05-26 ENCOUNTER — Ambulatory Visit (INDEPENDENT_AMBULATORY_CARE_PROVIDER_SITE_OTHER): Payer: Medicare Other | Admitting: Family Medicine

## 2017-05-26 VITALS — BP 130/80 | HR 75 | Temp 97.6°F | Wt 153.0 lb

## 2017-05-26 DIAGNOSIS — L820 Inflamed seborrheic keratosis: Secondary | ICD-10-CM | POA: Diagnosis not present

## 2017-05-26 DIAGNOSIS — I10 Essential (primary) hypertension: Secondary | ICD-10-CM | POA: Diagnosis not present

## 2017-05-26 DIAGNOSIS — G4700 Insomnia, unspecified: Secondary | ICD-10-CM

## 2017-05-26 DIAGNOSIS — R1011 Right upper quadrant pain: Secondary | ICD-10-CM | POA: Diagnosis not present

## 2017-05-26 DIAGNOSIS — L989 Disorder of the skin and subcutaneous tissue, unspecified: Secondary | ICD-10-CM

## 2017-05-26 MED ORDER — LORAZEPAM 2 MG PO TABS: ORAL_TABLET | ORAL | 1 refills | Status: DC

## 2017-05-26 NOTE — Patient Instructions (Signed)
It was nice to see you today- take care and I will be in touch with your pathology report from your skin lesion asap We will also set you up for an ultrasound of your gallbladder  Please keep your little wound covered and dry today- tomorrow you can shower and pat dry, apply a band-aid as needed If any sign of infection- redness, pain, pus- please do let me know

## 2017-05-29 ENCOUNTER — Ambulatory Visit (HOSPITAL_BASED_OUTPATIENT_CLINIC_OR_DEPARTMENT_OTHER)
Admission: RE | Admit: 2017-05-29 | Discharge: 2017-05-29 | Disposition: A | Discharge status: Home / Self Care | Payer: Medicare Other | Source: Ambulatory Visit | Attending: Family Medicine | Admitting: Family Medicine

## 2017-05-29 DIAGNOSIS — R1011 Right upper quadrant pain: Secondary | ICD-10-CM | POA: Diagnosis not present

## 2017-05-30 ENCOUNTER — Encounter: Payer: Self-pay | Admitting: Family Medicine

## 2017-06-01 ENCOUNTER — Encounter: Payer: Self-pay | Admitting: Family Medicine

## 2017-06-08 ENCOUNTER — Telehealth: Payer: Self-pay | Admitting: *Deleted

## 2017-06-08 NOTE — Telephone Encounter (Signed)
Received Dermatopathology Report results from [facility]; forwarded to provider/SLS 11/27    

## 2017-06-18 ENCOUNTER — Other Ambulatory Visit: Payer: Self-pay | Admitting: Family Medicine

## 2017-06-18 DIAGNOSIS — I10 Essential (primary) hypertension: Secondary | ICD-10-CM

## 2017-08-30 ENCOUNTER — Other Ambulatory Visit: Payer: Self-pay | Admitting: Family Medicine

## 2017-08-30 DIAGNOSIS — G4700 Insomnia, unspecified: Secondary | ICD-10-CM

## 2017-09-01 NOTE — Telephone Encounter (Signed)
Received med refill request for trazodONE (DESYREL) 50 MG tablet. Last office visit 05/26/17 and last refill 01/29/17. Please advise.

## 2017-11-09 ENCOUNTER — Other Ambulatory Visit: Payer: Self-pay | Admitting: Family Medicine

## 2017-11-09 DIAGNOSIS — G4700 Insomnia, unspecified: Secondary | ICD-10-CM

## 2017-11-11 NOTE — Telephone Encounter (Signed)
Received refill request for LORazepam (ATIVAN) 2 MG tablet . Last office visit and refill 05/26/17.

## 2017-11-12 ENCOUNTER — Encounter: Payer: Self-pay | Admitting: Family Medicine

## 2017-12-26 NOTE — Progress Notes (Addendum)
[place] at [facility] [address], [place], [place] [address] [phone]  Date:  12/27/2017   Name:  Kristina Richard   DOB:  [birth year]   MRN:  [id number]  PCP:  Darreld Mclean, MD    Chief Complaint: No chief complaint on file.   History of Present Illness:  Kristina Richard is a 77 y.o. very pleasant female patient who presents with the following:  Here today with concern of RUQ pain- in November she mentioned the following:  Also she notes that she will sometimes have some "gallbladder pain" in her RUQ . This is not severe- it is more of a tenderness to the touch.  However she would like to get an [place] which is a reasonable idea  We got an [place] which was negative  She notes that her pain has continued over the last 7 months- it seems to have spread to her right flank and into her RLQ She has been taking azo for this over the last few days- it does seem to help She notes urinary frequency but no dysuria No blood in her urine She feels like she may have a low grade fever and intermittent chills Her sx have been persistent since our last visit Sx are present most days but not 100% of the time  She also notes that she may have some pain radiating into her right hip   She is working again- she is sitting more and "stress eating" - notes that she has gained some weight and is not pleased about this   Wt Readings from Last 3 Encounters:  12/27/17 154 lb 12.8 oz (70.2 kg)  05/26/17 153 lb (69.4 kg)  03/22/17 150 lb 6.4 oz (68.2 kg)   No jaundice She considers her normal weight to be about 130 lbs; this was her weight for several years  Patient Active Problem List   Diagnosis Date Noted  . Osteopenia 04/15/2016  . Essential hypertension 09/26/2015  . Insomnia 09/26/2015  . DJD (degenerative joint disease) of knee 06/24/2015  . Primary localized osteoarthritis of left knee   . Bulging lumbar disc   . Cervical disc disease      Past Medical History:  Diagnosis Date  . Anemia   . Arthritis   . Back pain   . Cataract   . Diverticulosis   . Hemorrhoids   . History of bronchitis 4 yrs ago  . History of colon polyps   . Hypertension   . Insomnia    takes Xanax nightly as needed for insomnia or anxiety  . Joint pain   . Joint swelling   . Nocturia   . Pneumonia    hx of-most recent 5 yrs ago  . Primary localized osteoarthritis of left knee   . Rash    upper body and states that it nefver goes away.Saw a dermatologist  . Weakness    numbness and tingling in both     Past Surgical History:  Procedure Laterality Date  . APPENDECTOMY    . COLONOSCOPY    . EYE SURGERY Bilateral    cataract  . TONSILLECTOMY    . TOTAL KNEE ARTHROPLASTY Left 06/24/2015   Procedure: TOTAL KNEE ARTHROPLASTY;  Surgeon: Elsie Saas, MD;  Location: [facility];  Service: Orthopedics;  Laterality: Left;    Social History   Tobacco Use  . Smoking status: Never Smoker  . Smokeless tobacco: Never Used  Substance Use Topics  .  Alcohol use: No    Alcohol/week: 0.0 oz  . Drug use: No    Family History  Problem Relation Age of Onset  . Cancer Mother   . Hyperlipidemia Father   . Hypertension Father   . Heart disease Brother   . Hypertension Maternal Grandmother     Allergies  Allergen Reactions  . Hydrocodone Other (See Comments)    Reports that if she gets to much she had trouble breathing and blacked out  . Morphine And Related     Doesn't want any morphine and sick to stomach  . Codeine Other (See Comments)    Can do codones like hydrocodone or oxycodone    Medication list has been reviewed and updated.  Current Outpatient Medications on File Prior to Visit  Medication Sig Dispense Refill  . Biotin 2500 MCG CAPS Take 3,000 mcg by mouth.    . cholecalciferol (VITAMIN D) 1000 UNITS tablet Take 1,000 Units by mouth daily.    Marland Kitchen lisinopril (PRINIVIL,ZESTRIL) 10 MG tablet Take 1 tablet (10 mg total) by mouth  daily. 90 tablet 3  . LORazepam (ATIVAN) 2 MG tablet TAKE 1/2 TO 1 TABLET BY MOUTH AT BEDTIME AS NEEDED FOR INSOMINIA 30 tablet 0  . Lysine 500 MG TABS Take 500 mg by mouth daily.     Marland Kitchen MAGNESIUM CITRATE PO Take 150 mg by mouth 2 (two) times daily.    Marland Kitchen NIACIN PO Take 400 mg by mouth daily.    . traZODone (DESYREL) 50 MG tablet TAKE 1/2 TO 1 TABLET(25 TO 50 MG) BY MOUTH AT BEDTIME AS NEEDED FOR SLEEP 30 tablet 6   No current facility-administered medications on file prior to visit.     Review of Systems:  As per HPI- otherwise negative.   Physical Examination: Vitals:   12/27/17 1052  BP: (!) 150/84  Pulse: 75  Resp: 16  Temp: 98.2 F (36.8 C)  SpO2: 98%   Vitals:   12/27/17 1052  Weight: 154 lb 12.8 oz (70.2 kg)  Height: 5' 2.5" (1.588 m)   Body mass index is 27.86 kg/m. Ideal Body Weight: Weight in (lb) to have BMI = 25: 138.6  GEN: WDWN, NAD, Non-toxic, A & O x 3, looks well, mild overweight HEENT: Atraumatic, Normocephalic. Neck supple. No masses, No LAD.  Bilateral TM wnl, oropharynx normal.  PEERL,EOMI.   Ears and Nose: No external deformity. CV: RRR, No M/G/R. No JVD. No thrill. No extra heart sounds. PULM: CTA B, no wheezes, crackles, rhonchi. No retractions. No resp. distress. No accessory muscle use. ABD: S, NT, ND, +BS. No rebound. No HSM.  Belly is soft and benign today.  Pt endorses that her right upper quadrant and right lower quadrants feel slightly tender  EXTR: No c/c/e NEURO Normal gait.  PSYCH: Normally interactive. Conversant. Not depressed or anxious appearing.  Calm demeanor.   Results for orders placed or performed in visit on 12/27/17  POCT Urinalysis Dipstick (Automated)  Result Value Ref Range   Color, UA CLEAR    Clarity, UA YELLOW    Glucose, UA Negative Negative   Bilirubin, UA NEG    Ketones, UA NEG    Spec Grav, UA 1.010 1.010 - 1.025   Blood, UA NEG    pH, UA 6.0 5.0 - 8.0   Protein, UA Negative Negative   Urobilinogen, UA 0.2  0.2 or 1.0 E.U./dL   Nitrite, UA NEG    Leukocytes, UA Negative Negative    Assessment and Plan:  Right lower quadrant abdominal pain - Plan: POCT Urinalysis Dipstick (Automated), CBC, Comprehensive metabolic panel, Urine Culture, POCT urinalysis dipstick, Sedimentation rate  Right flank pain - Plan: CBC, Comprehensive metabolic panel, Urine Culture, POCT urinalysis dipstick, Sedimentation rate  Right hip pain - Plan: DG HIP UNILAT W OR W/O PELVIS 2-3 VIEWS RIGHT  Here today with persistent right sided upper and lower abd pain for 7 months+  Will check labs for her today and also plan to do a CT of her abd and pelvis  Will obtain plain films of her right hip today   Signed Lamar Blinks, MD  Received her labs and hip films 6/18, gave her a call to discuss .  No answer, LMOM.  I will send her a mychart message as well.  Discussed with radiology; we should get a pretty good look at her hip on planned CT abd pelvis, can get a dedicated hip MRI after if needed  Dg Hip Unilat W Or W/o Pelvis 2-3 Views Right  Result Date: 12/27/2017 CLINICAL DATA:  Right hip pain since January 2019. EXAM: DG HIP (WITH OR WITHOUT PELVIS) 2-3V RIGHT COMPARISON:  None in PACs FINDINGS: The bones are subjectively adequately mineralized. There is subtle lucency projecting over the femoral head and superior - lateral aspect of the acetabulum demonstrated best on the frog-leg lateral view. There is mild asymmetric joint space loss of the right hip. There is no acute fracture or dislocation. IMPRESSION: There is mild asymmetric joint space loss consistent with osteoarthritis. There is lucency projecting over the femoral head and acetabulum which could reflect a lytic lesion in the appropriate clinical setting. MRI or CT scanning of the right hip is recommended to exclude occult pathology. Electronically Signed   By: David  Martinique M.D.   On: 12/27/2017 11:46   Results for orders placed or performed in visit on 12/27/17   CBC  Result Value Ref Range   WBC 6.3 4.0 - 10.5 K/uL   RBC 4.91 3.87 - 5.11 Mil/uL   Platelets 338.0 150.0 - 400.0 K/uL   Hemoglobin 14.1 12.0 - 15.0 g/dL   HCT 41.8 36.0 - 46.0 %   MCV 85.2 78.0 - 100.0 fl   MCHC 33.8 30.0 - 36.0 g/dL   RDW 13.2 11.5 - 15.5 %  Comprehensive metabolic panel  Result Value Ref Range   Sodium 138 135 - 145 mEq/L   Potassium 4.2 3.5 - 5.1 mEq/L   Chloride 99 96 - 112 mEq/L   CO2 32 19 - 32 mEq/L   Glucose, Bld 80 70 - 99 mg/dL   BUN 10 6 - 23 mg/dL   Creatinine, Ser 0.73 0.40 - 1.20 mg/dL   Total Bilirubin 1.1 0.2 - 1.2 mg/dL   Alkaline Phosphatase 53 39 - 117 U/L   AST 16 0 - 37 U/L   ALT 14 0 - 35 U/L   Total Protein 6.7 6.0 - 8.3 g/dL   Albumin 4.3 3.5 - 5.2 g/dL   Calcium 9.5 8.4 - 10.5 mg/dL   GFR 82.56 >60.00 mL/min  Sedimentation rate  Result Value Ref Range   Sed Rate 3 0 - 30 mm/hr  POCT Urinalysis Dipstick (Automated)  Result Value Ref Range   Color, UA CLEAR    Clarity, UA YELLOW    Glucose, UA Negative Negative   Bilirubin, UA NEG    Ketones, UA NEG    Spec Grav, UA 1.010 1.010 - 1.025   Blood, UA NEG    pH,  UA 6.0 5.0 - 8.0   Protein, UA Negative Negative   Urobilinogen, UA 0.2 0.2 or 1.0 E.U./dL   Nitrite, UA NEG    Leukocytes, UA Negative Negative   Message to pt You do have some arthritis in your hip.  Also, they noted a little bone lesion in the head of the femur.  This is likely nothing of concern, but the radiologist should be able to get a better look on the CT we are planning to do.  If we need more info after that we can get an MRI! Let me know if you don't hear about your CT scan in the next several days  Received her urine culture 6/19, message to pt  MICRO NUMBER: [id number]   SPECIMEN QUALITY: ADEQUATE   Sample Source NOT GIVEN   STATUS: FINAL   ISOLATE 1: Multiple organisms present, each less than 10,000 CFU/mL. These organisms, commonly found on external and internal genitalia, are considered to be  colonizers. No further testing performed.

## 2017-12-27 ENCOUNTER — Ambulatory Visit (INDEPENDENT_AMBULATORY_CARE_PROVIDER_SITE_OTHER): Payer: Medicare Other | Admitting: Family Medicine

## 2017-12-27 ENCOUNTER — Encounter: Payer: Self-pay | Admitting: Family Medicine

## 2017-12-27 ENCOUNTER — Ambulatory Visit (HOSPITAL_BASED_OUTPATIENT_CLINIC_OR_DEPARTMENT_OTHER)
Admission: RE | Admit: 2017-12-27 | Discharge: 2017-12-27 | Disposition: A | Discharge status: Home / Self Care | Payer: Medicare Other | Source: Ambulatory Visit | Attending: Family Medicine | Admitting: Family Medicine

## 2017-12-27 VITALS — BP 150/84 | HR 75 | Temp 98.2°F | Resp 16 | Ht 62.5 in | Wt 154.8 lb

## 2017-12-27 DIAGNOSIS — M25551 Pain in right hip: Secondary | ICD-10-CM | POA: Diagnosis not present

## 2017-12-27 DIAGNOSIS — R140 Abdominal distension (gaseous): Secondary | ICD-10-CM

## 2017-12-27 DIAGNOSIS — D1621 Benign neoplasm of long bones of right lower limb: Secondary | ICD-10-CM

## 2017-12-27 DIAGNOSIS — R1031 Right lower quadrant pain: Secondary | ICD-10-CM | POA: Diagnosis not present

## 2017-12-27 DIAGNOSIS — R109 Unspecified abdominal pain: Secondary | ICD-10-CM | POA: Diagnosis not present

## 2017-12-27 DIAGNOSIS — M1611 Unilateral primary osteoarthritis, right hip: Secondary | ICD-10-CM | POA: Diagnosis not present

## 2017-12-27 LAB — POC URINALSYSI DIPSTICK (AUTOMATED)
BILIRUBIN UA: NEGATIVE
GLUCOSE UA: NEGATIVE
Ketones, UA: NEGATIVE
LEUKOCYTES UA: NEGATIVE
Nitrite, UA: NEGATIVE
PH UA: 6 (ref 5.0–8.0)
Protein, UA: NEGATIVE
RBC UA: NEGATIVE
Spec Grav, UA: 1.01 (ref 1.010–1.025)
UROBILINOGEN UA: 0.2 U/dL

## 2017-12-27 LAB — COMPREHENSIVE METABOLIC PANEL
ALBUMIN: 4.3 g/dL (ref 3.5–5.2)
ALK PHOS: 53 U/L (ref 39–117)
ALT: 14 U/L (ref 0–35)
AST: 16 U/L (ref 0–37)
BUN: 10 mg/dL (ref 6–23)
CALCIUM: 9.5 mg/dL (ref 8.4–10.5)
CO2: 32 mEq/L (ref 19–32)
CREATININE: 0.73 mg/dL (ref 0.40–1.20)
Chloride: 99 mEq/L (ref 96–112)
GFR: 82.56 mL/min (ref >60.00)
Glucose, Bld: 80 mg/dL (ref 70–99)
Potassium: 4.2 mEq/L (ref 3.5–5.1)
SODIUM: 138 meq/L (ref 135–145)
TOTAL PROTEIN: 6.7 g/dL (ref 6.0–8.3)
Total Bilirubin: 1.1 mg/dL (ref 0.2–1.2)

## 2017-12-27 LAB — SEDIMENTATION RATE: SED RATE: 3 mm/h (ref 0–30)

## 2017-12-27 LAB — CBC
HEMATOCRIT: 41.8 % (ref 36.0–46.0)
Hemoglobin: 14.1 g/dL (ref 12.0–15.0)
MCHC: 33.8 g/dL (ref 30.0–36.0)
MCV: 85.2 fl (ref 78.0–100.0)
PLATELETS: 338 10*3/uL (ref 150.0–400.0)
RBC: 4.91 Mil/uL (ref 3.87–5.11)
RDW: 13.2 % (ref 11.5–15.5)
WBC: 6.3 10*3/uL (ref 4.0–10.5)

## 2017-12-27 NOTE — Patient Instructions (Signed)
It was good to see you today- take care and I will be in touch with your labs and hip x-rays asap We will get started on a CT abdomen/ pelvis for you.  Let me know if any change in your symptoms in the meantime!

## 2017-12-28 ENCOUNTER — Encounter: Payer: Self-pay | Admitting: Family Medicine

## 2017-12-28 LAB — URINE CULTURE
MICRO NUMBER: 90722204
SPECIMEN QUALITY: ADEQUATE

## 2017-12-29 ENCOUNTER — Encounter: Payer: Self-pay | Admitting: Family Medicine

## 2017-12-31 ENCOUNTER — Ambulatory Visit (HOSPITAL_BASED_OUTPATIENT_CLINIC_OR_DEPARTMENT_OTHER)
Admission: RE | Admit: 2017-12-31 | Discharge: 2017-12-31 | Disposition: A | Discharge status: Home / Self Care | Payer: Medicare Other | Source: Ambulatory Visit | Attending: Family Medicine | Admitting: Family Medicine

## 2017-12-31 ENCOUNTER — Encounter (HOSPITAL_BASED_OUTPATIENT_CLINIC_OR_DEPARTMENT_OTHER): Payer: Self-pay

## 2017-12-31 DIAGNOSIS — K5730 Diverticulosis of large intestine without perforation or abscess without bleeding: Secondary | ICD-10-CM | POA: Insufficient documentation

## 2017-12-31 DIAGNOSIS — M25551 Pain in right hip: Secondary | ICD-10-CM | POA: Insufficient documentation

## 2017-12-31 DIAGNOSIS — R1031 Right lower quadrant pain: Secondary | ICD-10-CM | POA: Diagnosis not present

## 2017-12-31 DIAGNOSIS — R140 Abdominal distension (gaseous): Secondary | ICD-10-CM | POA: Diagnosis not present

## 2017-12-31 DIAGNOSIS — R109 Unspecified abdominal pain: Secondary | ICD-10-CM | POA: Diagnosis not present

## 2017-12-31 DIAGNOSIS — D1621 Benign neoplasm of long bones of right lower limb: Secondary | ICD-10-CM | POA: Insufficient documentation

## 2017-12-31 DIAGNOSIS — I700 Atherosclerosis of aorta: Secondary | ICD-10-CM | POA: Diagnosis not present

## 2017-12-31 MED ORDER — IOPAMIDOL (ISOVUE-300) INJECTION 61%
100.0000 mL | Freq: Once | INTRAVENOUS | Status: AC | PRN
  Administered 2017-12-31: 100 mL via INTRAVENOUS

## 2018-01-02 ENCOUNTER — Encounter: Payer: Self-pay | Admitting: Family Medicine

## 2018-01-04 ENCOUNTER — Encounter: Payer: Self-pay | Admitting: Family Medicine

## 2018-01-06 NOTE — Telephone Encounter (Signed)
Called pt to check on her symptoms- no answer, LMOM.  Asked her to please update me, can reply to the mychart message that I had sent her

## 2018-03-25 DIAGNOSIS — H26491 Other secondary cataract, right eye: Secondary | ICD-10-CM | POA: Diagnosis not present

## 2018-03-25 DIAGNOSIS — Z961 Presence of intraocular lens: Secondary | ICD-10-CM | POA: Diagnosis not present

## 2018-03-29 ENCOUNTER — Other Ambulatory Visit: Payer: Self-pay

## 2018-03-29 DIAGNOSIS — G4700 Insomnia, unspecified: Secondary | ICD-10-CM

## 2018-03-29 MED ORDER — TRAZODONE HCL 50 MG PO TABS: ORAL_TABLET | ORAL | 6 refills | Status: DC

## 2018-04-04 ENCOUNTER — Encounter: Payer: Self-pay | Admitting: Family Medicine

## 2018-05-23 ENCOUNTER — Other Ambulatory Visit: Payer: Self-pay | Admitting: Family Medicine

## 2018-05-23 DIAGNOSIS — I10 Essential (primary) hypertension: Secondary | ICD-10-CM

## 2018-11-14 ENCOUNTER — Other Ambulatory Visit: Payer: Self-pay | Admitting: Family Medicine

## 2018-11-14 DIAGNOSIS — I10 Essential (primary) hypertension: Secondary | ICD-10-CM

## 2018-11-15 NOTE — Telephone Encounter (Signed)
Spoke with patient and explained to her virtual visits. Patient set up for 5/11 virtual visit with PCP

## 2018-11-20 NOTE — Patient Instructions (Addendum)
It was very nice to talk with you today I ordered your mammogram and bone density test for you, to be done here at the med center  We increased your lisinopril from 10 mg to 20 mg daily today.  Please let me know how your blood pressure response, he can reply to me directly here  I also prescribed Lorazepam-Ativan- to use as needed for insomnia.  Please use this medication sparingly as it can be habit-forming.  Do not drive after taking this medication, and do not combine with any other sedating substance  Let us plan to visit in the office when feasible, perhaps late summer or fall  Meds ordered this encounter  Medications  . lisinopril (ZESTRIL) 20 MG tablet    Sig: TAKE 1 TABLET(20 MG) BY MOUTH DAILY    Dispense:  90 tablet    Refill:  3  . LORazepam (ATIVAN) 2 MG tablet    Sig: TAKE 1/2 TO 1 TABLET BY MOUTH AT BEDTIME AS NEEDED FOR INSOMINIA    Dispense:  30 tablet    Refill:  1

## 2018-11-20 NOTE — Progress Notes (Signed)
[place] at [facility] [address] [place], [place] [address] [phone]  Date:  11/21/2018   Name:  Kristina Richard   DOB:  21-Jan-1941   MRN:  [id number]  PCP:  Darreld Mclean, MD    Chief Complaint: No chief complaint on file.   History of Present Illness:  Kristina Richard is a 78 y.o. very pleasant female patient who presents with the following:  Virtual follow-up visit today-we had planned a doxy.me visit, but the patient is not able to get the camera to work so converted to phone Patient location is home Provider location is office Pt ID confirmed with name and DOB She gives consent for virtual visit   History of hypertension, knee arthritis, osteopenia, insomnia I last saw Kristina Richard in the office last June with persistent right upper quadrant and right lower abdominal pain for 7 months.  We got a CT scan of her abdomen which showed just diverticulosis-there was a question of abnormality of her hip on plain film, but on CT this looked okay  She notes that her sx as above are ok; she rarely notes some discomfort, but this is much better  DEXA scan is due- will order Mammogram- will order Mon/ tues/ wed are best; these are the days she spends in [facility], taking care of her sister Pneumonia vaccines- she declines  Shingrix- she declines   She notes that she has checked her BP the last few days; 152/80, 159/85, 150/80, 141/74, 139/70, 149/75, 147/82 She has tried increasing her lisinorpril to 20 mg on occasion, she notes that her blood pressure does not go too low with this dose  She also notes chronic insomnia.  This is been a problem for her for quite some time.  She was on Ambien in the past but stopped this.  We have also tried trazodone, but it was not very helpful.  She notes that 2 of her sisters are taking Xanax for sleep, which seems to help them.  We did give her some Ativan last year which she tolerated  well. Advised I can prescribe Ativan for her, but would suggest that she use this on an occasional basis as it can be habit-forming.  Also do not drive after taking this medication  BP Readings from Last 3 Encounters:  12/27/17 (!) 150/84  05/26/17 130/80  03/22/17 (!) 160/90    Patient Active Problem List   Diagnosis Date Noted  . Osteopenia 04/15/2016  . Essential hypertension 09/26/2015  . Insomnia 09/26/2015  . DJD (degenerative joint disease) of knee 06/24/2015  . Primary localized osteoarthritis of left knee   . Bulging lumbar disc   . Cervical disc disease     Past Medical History:  Diagnosis Date  . Anemia   . Arthritis   . Back pain   . Cataract   . Diverticulosis   . Hemorrhoids   . History of bronchitis 4 yrs ago  . History of colon polyps   . Hypertension   . Insomnia    takes Xanax nightly as needed for insomnia or anxiety  . Joint pain   . Joint swelling   . Nocturia   . Pneumonia    hx of-most recent 5 yrs ago  . Primary localized osteoarthritis of left knee   . Rash    upper body and states that it nefver goes away.Saw a dermatologist  . Weakness    numbness and tingling in  both     Past Surgical History:  Procedure Laterality Date  . APPENDECTOMY    . COLONOSCOPY    . EYE SURGERY Bilateral    cataract  . TONSILLECTOMY    . TOTAL KNEE ARTHROPLASTY Left 06/24/2015   Procedure: TOTAL KNEE ARTHROPLASTY;  Surgeon: Elsie Saas, MD;  Location: [facility];  Service: Orthopedics;  Laterality: Left;    Social History   Tobacco Use  . Smoking status: Never Smoker  . Smokeless tobacco: Never Used  Substance Use Topics  . Alcohol use: No    Alcohol/week: 0.0 standard drinks  . Drug use: No    Family History  Problem Relation Age of Onset  . Cancer Mother   . Hyperlipidemia Father   . Hypertension Father   . Heart disease Brother   . Hypertension Maternal Grandmother     Allergies  Allergen Reactions  . Hydrocodone Other (See Comments)     Reports that if she gets to much she had trouble breathing and blacked out  . Morphine And Related     Doesn't want any morphine and sick to stomach  . Codeine Other (See Comments)    Can do codones like hydrocodone or oxycodone    Medication list has been reviewed and updated.  Current Outpatient Medications on File Prior to Visit  Medication Sig Dispense Refill  . Biotin 2500 MCG CAPS Take 3,000 mcg by mouth.    . cholecalciferol (VITAMIN D) 1000 UNITS tablet Take 1,000 Units by mouth daily.    Marland Kitchen lisinopril (ZESTRIL) 10 MG tablet TAKE 1 TABLET(10 MG) BY MOUTH DAILY 30 tablet 0  . LORazepam (ATIVAN) 2 MG tablet TAKE 1/2 TO 1 TABLET BY MOUTH AT BEDTIME AS NEEDED FOR INSOMINIA 30 tablet 0  . Lysine 500 MG TABS Take 500 mg by mouth daily.     Marland Kitchen MAGNESIUM CITRATE PO Take 150 mg by mouth 2 (two) times daily.    Marland Kitchen NIACIN PO Take 400 mg by mouth daily.    . traZODone (DESYREL) 50 MG tablet TAKE 1/2 TO 1 TABLET(25 TO 50 MG) BY MOUTH AT BEDTIME AS NEEDED FOR SLEEP 30 tablet 6   No current facility-administered medications on file prior to visit.     Review of Systems:  As per HPI- otherwise negative. No fever or chills, no cough  Physical Examination: There were no vitals filed for this visit. There were no vitals filed for this visit. There is no height or weight on file to calculate BMI. Ideal Body Weight:    Phone visit today - pt sounds well, no cough, wheezing, tachypnea noted  Assessment and Plan: Screening for breast cancer - Plan: MM DIAG BREAST TOMO BILATERAL  Essential hypertension - Plan: lisinopril (ZESTRIL) 20 MG tablet  Insomnia, unspecified type - Plan: LORazepam (ATIVAN) 2 MG tablet  Estrogen deficiency - Plan: DG Bone Density  Ordered screening mammogram and DEXA scan today Patient has noted elevated blood pressure at home.  We will increase her lisinopril from 10 to 20 mg.  She will continue to monitor her blood pressure, will let me know how she  responds Prescription for lorazepam to use as needed for insomnia today.  Advised her that this medication can be habit-forming, use as infrequently as possible.  Do not drive after taking this medication Spoke with pt for 15:04 today  Signed Lamar Blinks, MD  Patient sent the following my chart message:  It was very nice to talk with you today I ordered your  mammogram and bone density test for you, to be done here at the med center  We increased your lisinopril from 10 mg to 20 mg daily today.  Please let me know how your blood pressure response, he can reply to me directly here  I also prescribed Lorazepam-Ativan- to use as needed for insomnia.  Please use this medication sparingly as it can be habit-forming.  Do not drive after taking this medication, and do not combine with any other sedating substance  Let us plan to visit in the office when feasible, perhaps late summer or fall  Meds ordered this encounter  Medications  . lisinopril (ZESTRIL) 20 MG tablet    Sig: TAKE 1 TABLET(20 MG) BY MOUTH DAILY    Dispense:  90 tablet    Refill:  3  . LORazepam (ATIVAN) 2 MG tablet    Sig: TAKE 1/2 TO 1 TABLET BY MOUTH AT BEDTIME AS NEEDED FOR INSOMINIA    Dispense:  30 tablet    Refill:  1

## 2018-11-21 ENCOUNTER — Ambulatory Visit (INDEPENDENT_AMBULATORY_CARE_PROVIDER_SITE_OTHER): Payer: Medicare Other | Admitting: Family Medicine

## 2018-11-21 ENCOUNTER — Other Ambulatory Visit: Payer: Self-pay

## 2018-11-21 ENCOUNTER — Encounter: Payer: Self-pay | Admitting: Family Medicine

## 2018-11-21 DIAGNOSIS — E2839 Other primary ovarian failure: Secondary | ICD-10-CM

## 2018-11-21 DIAGNOSIS — Z1239 Encounter for other screening for malignant neoplasm of breast: Secondary | ICD-10-CM | POA: Diagnosis not present

## 2018-11-21 DIAGNOSIS — G4700 Insomnia, unspecified: Secondary | ICD-10-CM | POA: Diagnosis not present

## 2018-11-21 DIAGNOSIS — I10 Essential (primary) hypertension: Secondary | ICD-10-CM

## 2018-11-21 MED ORDER — LISINOPRIL 20 MG PO TABS: ORAL_TABLET | ORAL | 3 refills | Status: DC

## 2018-11-21 MED ORDER — LORAZEPAM 2 MG PO TABS: ORAL_TABLET | ORAL | 1 refills | Status: DC

## 2018-12-12 ENCOUNTER — Other Ambulatory Visit: Payer: Self-pay | Admitting: Family Medicine

## 2018-12-12 DIAGNOSIS — J029 Acute pharyngitis, unspecified: Secondary | ICD-10-CM | POA: Diagnosis not present

## 2018-12-12 DIAGNOSIS — R509 Fever, unspecified: Secondary | ICD-10-CM | POA: Diagnosis not present

## 2018-12-12 DIAGNOSIS — I10 Essential (primary) hypertension: Secondary | ICD-10-CM

## 2019-01-24 ENCOUNTER — Encounter (HOSPITAL_BASED_OUTPATIENT_CLINIC_OR_DEPARTMENT_OTHER): Payer: Self-pay

## 2019-01-24 ENCOUNTER — Ambulatory Visit (HOSPITAL_BASED_OUTPATIENT_CLINIC_OR_DEPARTMENT_OTHER)
Admission: RE | Admit: 2019-01-24 | Discharge: 2019-01-24 | Disposition: A | Discharge status: Home / Self Care | Payer: Medicare Other | Source: Ambulatory Visit | Attending: Family Medicine | Admitting: Family Medicine

## 2019-01-24 ENCOUNTER — Encounter: Payer: Self-pay | Admitting: Family Medicine

## 2019-01-24 ENCOUNTER — Other Ambulatory Visit: Payer: Self-pay

## 2019-01-24 DIAGNOSIS — E2839 Other primary ovarian failure: Secondary | ICD-10-CM | POA: Insufficient documentation

## 2019-01-24 DIAGNOSIS — Z1231 Encounter for screening mammogram for malignant neoplasm of breast: Secondary | ICD-10-CM | POA: Insufficient documentation

## 2019-01-24 DIAGNOSIS — M85852 Other specified disorders of bone density and structure, left thigh: Secondary | ICD-10-CM | POA: Insufficient documentation

## 2019-01-24 DIAGNOSIS — Z1239 Encounter for other screening for malignant neoplasm of breast: Secondary | ICD-10-CM

## 2019-01-25 ENCOUNTER — Other Ambulatory Visit: Payer: Self-pay | Admitting: Family Medicine

## 2019-01-25 DIAGNOSIS — R928 Other abnormal and inconclusive findings on diagnostic imaging of breast: Secondary | ICD-10-CM

## 2019-01-26 ENCOUNTER — Other Ambulatory Visit: Payer: Self-pay

## 2019-01-26 ENCOUNTER — Ambulatory Visit
Admission: RE | Admit: 2019-01-26 | Discharge: 2019-01-26 | Disposition: A | Discharge status: Home / Self Care | Payer: Medicare Other | Source: Ambulatory Visit | Attending: Family Medicine | Admitting: Family Medicine

## 2019-01-26 ENCOUNTER — Ambulatory Visit: Payer: Medicare Other

## 2019-01-26 DIAGNOSIS — R928 Other abnormal and inconclusive findings on diagnostic imaging of breast: Secondary | ICD-10-CM

## 2019-02-07 IMAGING — US US ABDOMEN LIMITED
1 series · 14 of 25 positions shown · non-contrast
Comparison: None.

CLINICAL DATA: Right upper quadrant tenderness for 2 weeks.

EXAM:
ULTRASOUND ABDOMEN LIMITED RIGHT UPPER QUADRANT

[Series 1: us abdomen limited · 0.17mm/px · 14 of 31 slices shown]
[im 1/31]
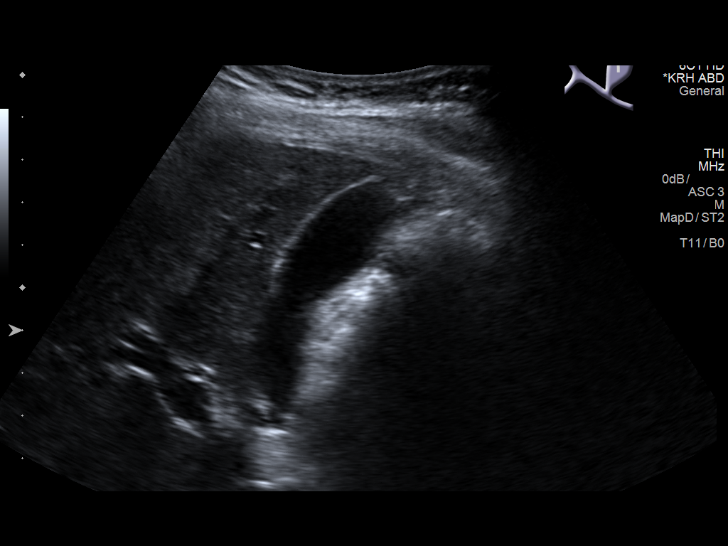
[im 3/31]
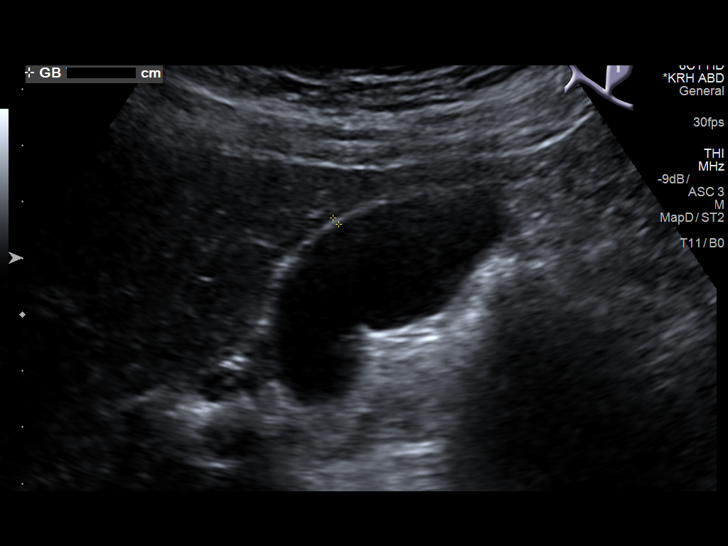
[im 6/31]
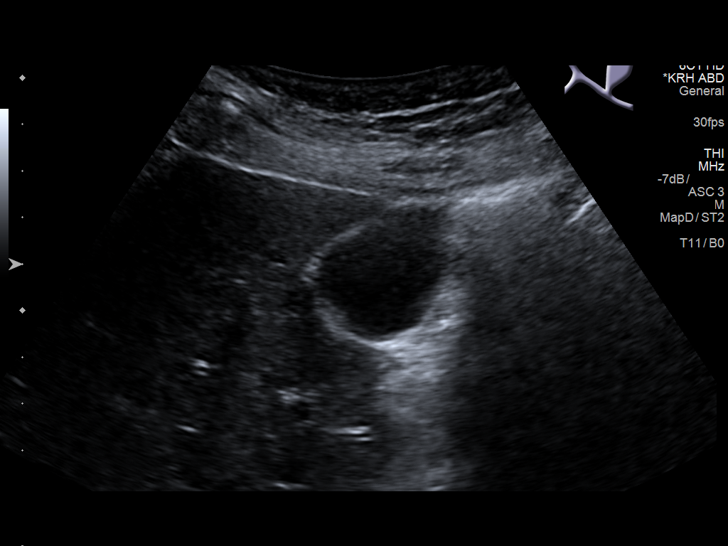
[im 8/31]
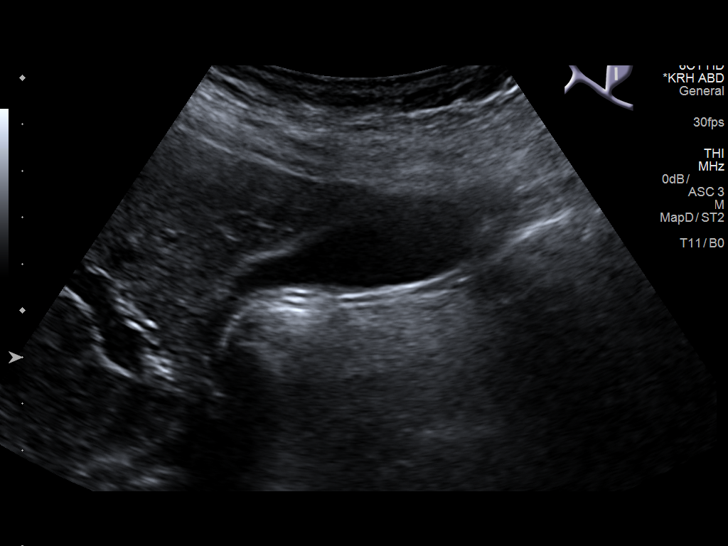
[im 11/31]
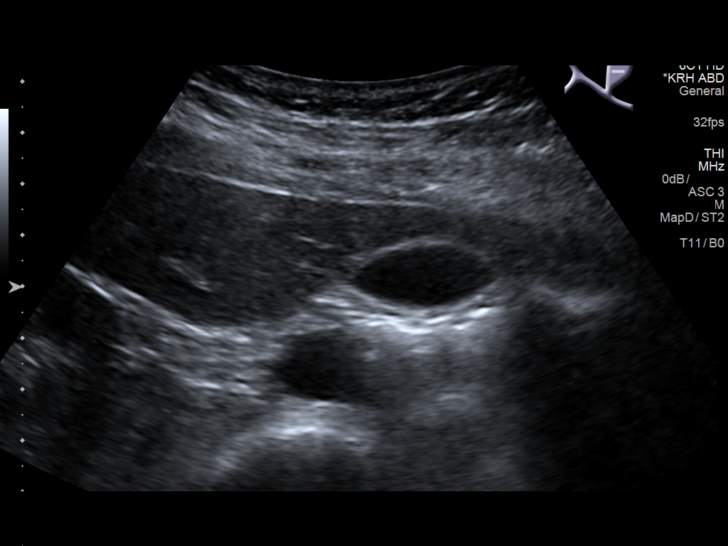
[im 12/31]
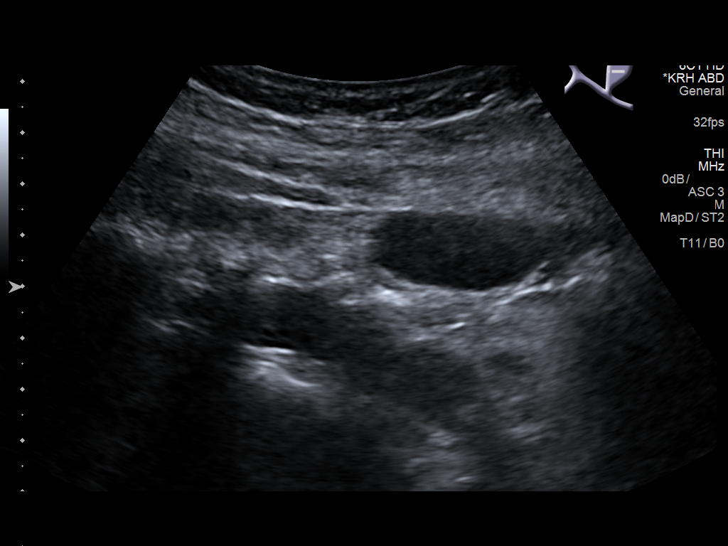
[im 14/31]
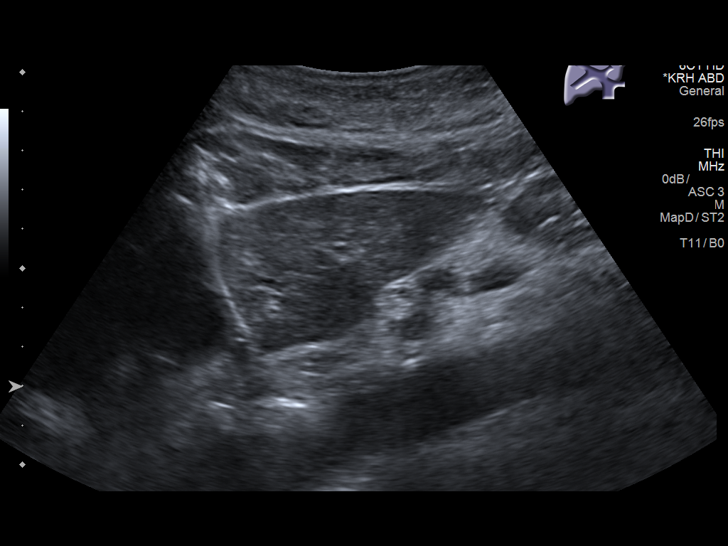
[im 17/31]
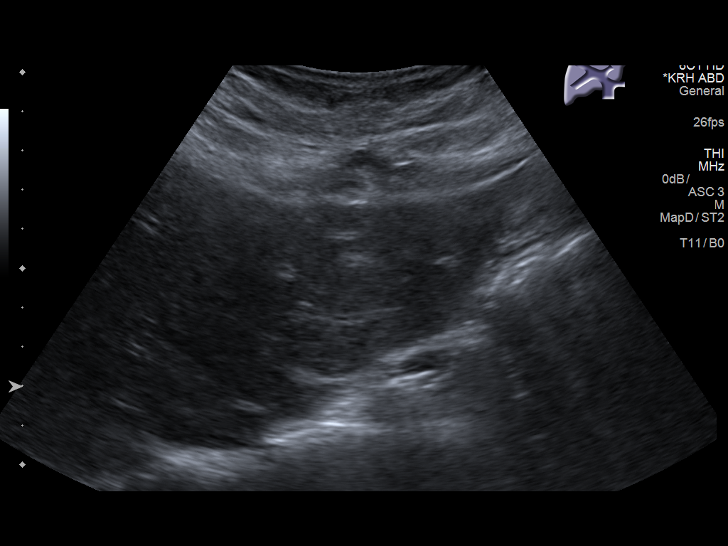
[im 19/31]
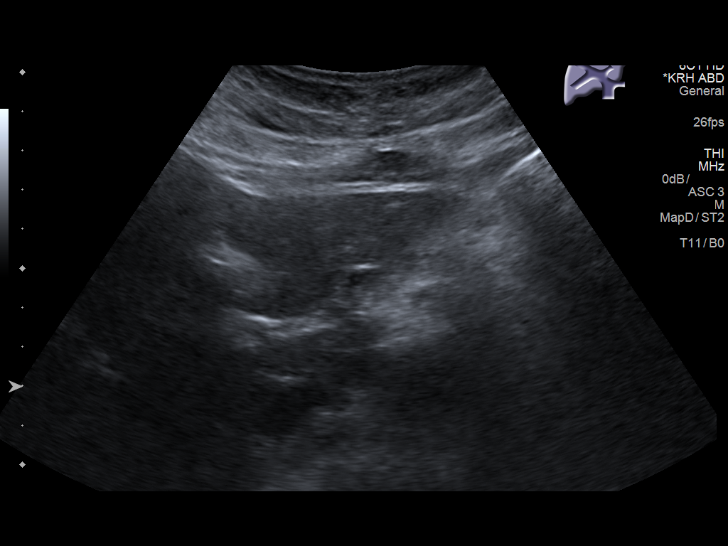
[im 21/31]
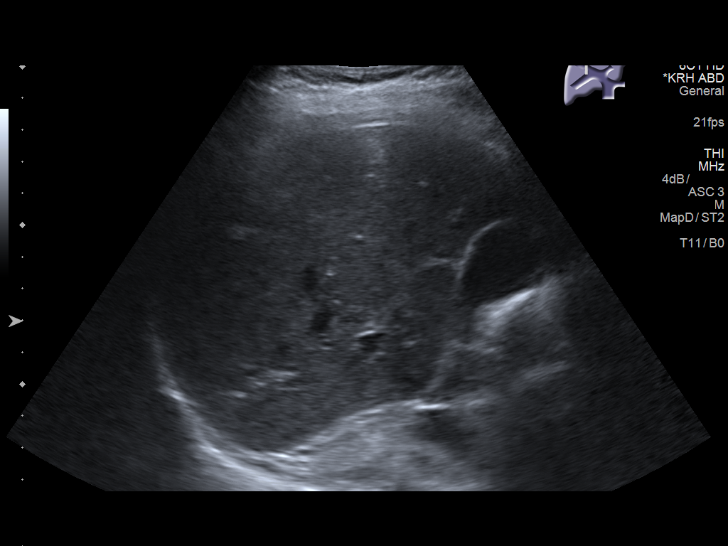
[im 23/31]
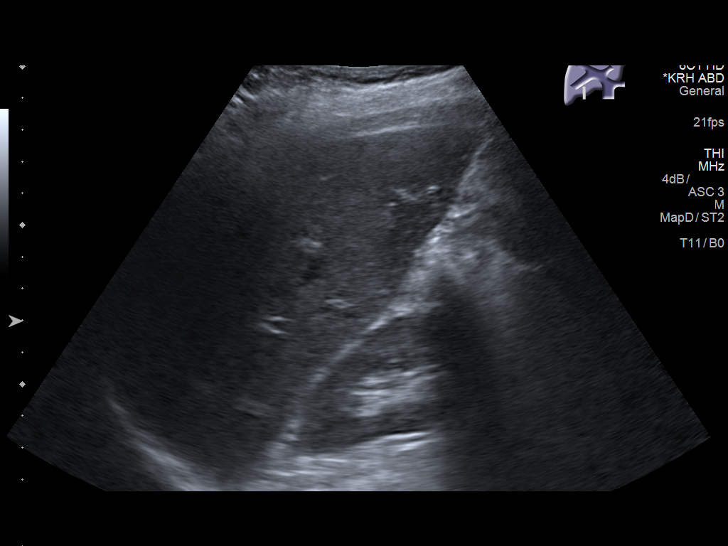
[im 26/31]
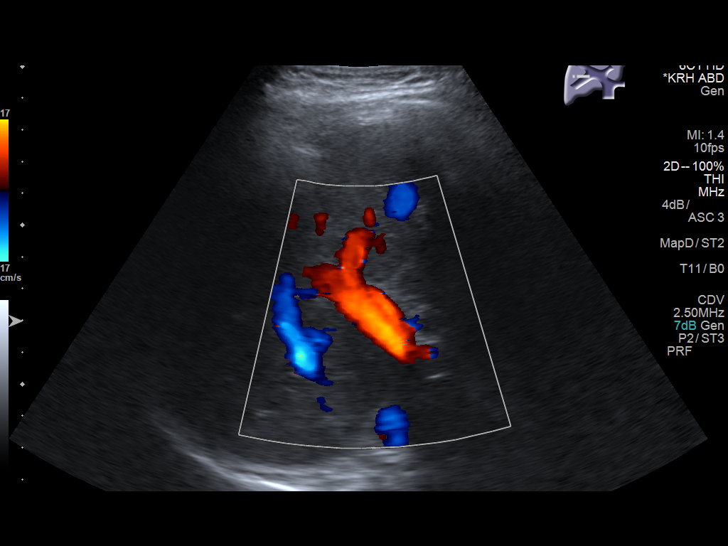
[im 28/31]
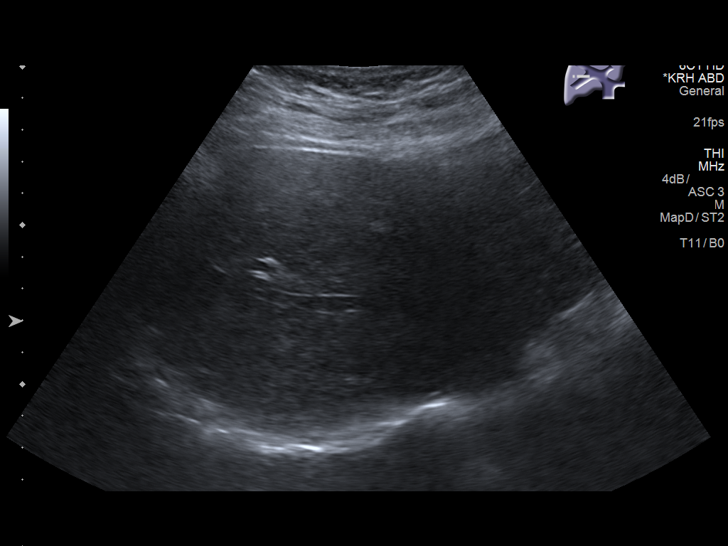
[im 31/31]
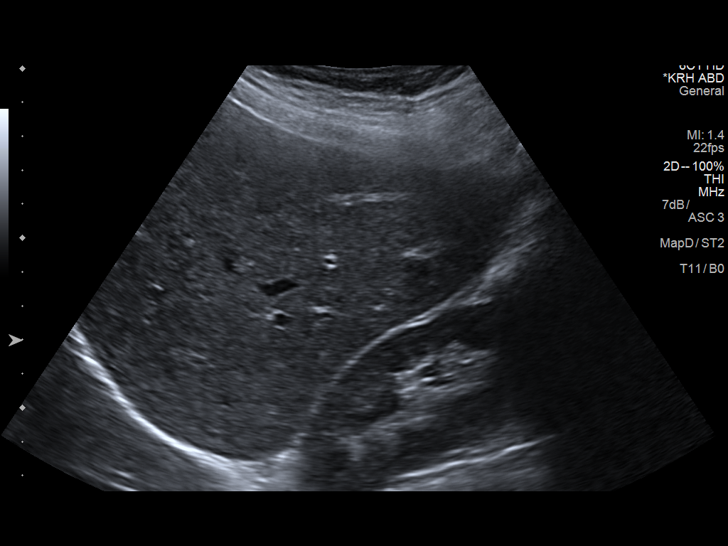

[14 of 25 positions shown; findings below may reference images not displayed]

FINDINGS: Gallbladder:

No gallstones or wall thickening visualized. No sonographic Murphy
sign noted by sonographer.

Common bile duct:

Diameter: 1 mm

Liver:

No focal lesion identified. Within normal limits in parenchymal
echogenicity. Portal vein is patent on color Doppler imaging with
normal direction of blood flow towards the liver.
IMPRESSION: No abnormalities identified.

## 2019-02-14 NOTE — Progress Notes (Deleted)
[place] at [facility] [address] [place], [place] [address] [phone]  Date:  02/15/2019   Name:  Kristina Richard   DOB:  [birth year]   MRN:  [id number]  PCP:  Darreld Mclean, MD    Chief Complaint: No chief complaint on file.   History of Present Illness:  Kristina Richard is a 78 y.o. 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  I also did prescribe lorazepam for her to use on occasion for insomnia Last labs about a year ago, update today  Patient Active Problem List   Diagnosis Date Noted  . Osteopenia 04/15/2016  . Essential hypertension 09/26/2015  . Insomnia 09/26/2015  . DJD (degenerative joint disease) of knee 06/24/2015  . Primary localized osteoarthritis of left knee   . Bulging lumbar disc   . Cervical disc disease     Past Medical History:  Diagnosis Date  . Anemia   . Arthritis   . Back pain   . Cataract   . Diverticulosis   . Hemorrhoids   . History of bronchitis 4 yrs ago  . History of colon polyps   . Hypertension   . Insomnia    takes Xanax nightly as needed for insomnia or anxiety  . Joint pain   . Joint swelling   . Nocturia   . Pneumonia    hx of-most recent 5 yrs ago  . Primary localized osteoarthritis of left knee   . Rash    upper body and states that it nefver goes away.Saw a dermatologist  . Weakness    numbness and tingling in both     Past Surgical History:  Procedure Laterality Date  . APPENDECTOMY    . COLONOSCOPY    . EYE SURGERY Bilateral    cataract  . TONSILLECTOMY    . TOTAL KNEE ARTHROPLASTY Left 06/24/2015   Procedure: TOTAL KNEE ARTHROPLASTY;  Surgeon: Elsie Saas, MD;   Location: [facility];  Service: Orthopedics;  Laterality: Left;    Social History   Tobacco Use  . Smoking status: Never Smoker  . Smokeless tobacco: Never Used  Substance Use Topics  . Alcohol use: No    Alcohol/week: 0.0 standard drinks  . Drug use: No    Family History  Problem Relation Age of Onset  . Cancer Mother   . Hyperlipidemia Father   . Hypertension Father   . Heart disease Brother   . Hypertension Maternal Grandmother     Allergies  Allergen Reactions  . Hydrocodone Other (See Comments)    Reports that if she gets to much she had trouble breathing and blacked out  . Morphine And Related     Doesn't want any morphine and sick to stomach  . Codeine Other (See Comments)    Can do codones like hydrocodone or oxycodone    Medication list has been reviewed and updated.  Current Outpatient Medications on File Prior to Visit  Medication Sig Dispense Refill  . Biotin 2500 MCG CAPS Take 3,000 mcg by mouth.    . cholecalciferol (VITAMIN D) 1000 UNITS tablet Take 1,000 Units by mouth daily.    Marland Kitchen  lisinopril (ZESTRIL) 10 MG tablet TAKE 1 TABLET(10 MG) BY MOUTH DAILY 90 tablet 1  . lisinopril (ZESTRIL) 20 MG tablet TAKE 1 TABLET(20 MG) BY MOUTH DAILY 90 tablet 3  . LORazepam (ATIVAN) 2 MG tablet TAKE 1/2 TO 1 TABLET BY MOUTH AT BEDTIME AS NEEDED FOR INSOMINIA 30 tablet 1  . Lysine 500 MG TABS Take 500 mg by mouth daily.     Marland Kitchen MAGNESIUM CITRATE PO Take 150 mg by mouth 2 (two) times daily.    Marland Kitchen NIACIN PO Take 400 mg by mouth daily.     No current facility-administered medications on file prior to visit.     Review of Systems:  As per HPI- otherwise negative.   Physical Examination: There were no vitals filed for this visit. There were no vitals filed for this visit. There is no height or weight on file to calculate BMI. Ideal Body Weight:    GEN: WDWN, NAD, Non-toxic, A & O x 3 HEENT: Atraumatic, Normocephalic. Neck supple. No masses, No LAD. Ears and Nose: No  external deformity. CV: RRR, No M/G/R. No JVD. No thrill. No extra heart sounds. PULM: CTA B, no wheezes, crackles, rhonchi. No retractions. No resp. distress. No accessory muscle use. ABD: S, NT, ND, +BS. No rebound. No HSM. EXTR: No c/c/e NEURO Normal gait.  PSYCH: Normally interactive. Conversant. Not depressed or anxious appearing.  Calm demeanor.    Assessment and Plan: ***  Signed Lamar Blinks, MD

## 2019-02-15 ENCOUNTER — Ambulatory Visit: Payer: Medicare Other | Admitting: Family Medicine

## 2019-02-22 ENCOUNTER — Ambulatory Visit: Payer: Medicare Other | Admitting: Family Medicine

## 2019-03-28 DIAGNOSIS — H524 Presbyopia: Secondary | ICD-10-CM | POA: Diagnosis not present

## 2019-03-28 DIAGNOSIS — H04123 Dry eye syndrome of bilateral lacrimal glands: Secondary | ICD-10-CM | POA: Diagnosis not present

## 2019-03-28 DIAGNOSIS — H52203 Unspecified astigmatism, bilateral: Secondary | ICD-10-CM | POA: Diagnosis not present

## 2019-03-28 DIAGNOSIS — H26491 Other secondary cataract, right eye: Secondary | ICD-10-CM | POA: Diagnosis not present

## 2019-05-11 DIAGNOSIS — H26491 Other secondary cataract, right eye: Secondary | ICD-10-CM | POA: Diagnosis not present

## 2019-05-19 NOTE — Progress Notes (Signed)
[place] at [facility] [address] [place], [place] [address] [phone]  Date:  05/22/2019   Name:  Kristina Richard   DOB:  10-02-1940   MRN:  [id number]  PCP:  Darreld Mclean, MD    Chief Complaint: No chief complaint on file.   History of Present Illness:  Kristina Richard is a 78 y.o. very pleasant female patient who presents with the following:  Here today for a 3 month follow-up visit History of HTN, spinal disc disease, chronic insomnia, OA Last seen by myself in May for a virtual visit - at that time I prescribed lorazepam to use prn for insomnia And we also increased her lisinopril from 10 to 20 mg for HTN  She spends part of the week in [place] taking care of her sister- she has MS, they are experimenting with some new medications for her and she has   Flu shot: declines flu shot today dexa done in July mammo done July Most recent BW a year ago, had plan to update today but she declines, prefers to delay due to pandemic Shingles vaccine? Declines to have this done as well  She is taking 20 mg of lisinopril daily, sometimes up to 30mg  when her blood pressure is higher Her BP is running about 140s/70s She had some eye surgery a couple of weeks ago and her BP was 143/67  We discussed increasing her lisinopril to 40.  She does notice that she may feel lightheaded at times, she would prefer not to increase due to her borderline low systolic blood pressure.  I agree that this is a reasonable plan   Patient Active Problem List   Diagnosis Date Noted  . Osteopenia 04/15/2016  . Essential hypertension 09/26/2015  . Insomnia 09/26/2015  . DJD (degenerative joint disease) of knee 06/24/2015  . Primary localized osteoarthritis of left knee   . Bulging lumbar disc   . Cervical disc disease     Past Medical History:  Diagnosis Date  . Anemia   . Arthritis   . Back pain   . Cataract   . Diverticulosis   .  Hemorrhoids   . History of bronchitis 4 yrs ago  . History of colon polyps   . Hypertension   . Insomnia    takes Xanax nightly as needed for insomnia or anxiety  . Joint pain   . Joint swelling   . Nocturia   . Pneumonia    hx of-most recent 5 yrs ago  . Primary localized osteoarthritis of left knee   . Rash    upper body and states that it nefver goes away.Saw a dermatologist  . Weakness    numbness and tingling in both     Past Surgical History:  Procedure Laterality Date  . APPENDECTOMY    . COLONOSCOPY    . EYE SURGERY Bilateral    cataract  . TONSILLECTOMY    . TOTAL KNEE ARTHROPLASTY Left 06/24/2015   Procedure: TOTAL KNEE ARTHROPLASTY;  Surgeon: Elsie Saas, MD;  Location: [facility];  Service: Orthopedics;  Laterality: Left;    Social History   Tobacco Use  . Smoking status: Never Smoker  . Smokeless tobacco: Never Used  Substance Use Topics  . Alcohol use: No    Alcohol/week: 0.0 standard drinks  . Drug use: No    Family History  Problem Relation Age of Onset  . Cancer Mother   . Hyperlipidemia Father   .  Hypertension Father   . Heart disease Brother   . Hypertension Maternal Grandmother     Allergies  Allergen Reactions  . Hydrocodone Other (See Comments)    Reports that if she gets to much she had trouble breathing and blacked out  . Morphine And Related     Doesn't want any morphine and sick to stomach  . Codeine Other (See Comments)    Can do codones like hydrocodone or oxycodone    Medication list has been reviewed and updated.  Current Outpatient Medications on File Prior to Visit  Medication Sig Dispense Refill  . Biotin 2500 MCG CAPS Take 3,000 mcg by mouth.    . cholecalciferol (VITAMIN D) 1000 UNITS tablet Take 1,000 Units by mouth daily.    Marland Kitchen lisinopril (ZESTRIL) 10 MG tablet TAKE 1 TABLET(10 MG) BY MOUTH DAILY 90 tablet 1  . lisinopril (ZESTRIL) 20 MG tablet TAKE 1 TABLET(20 MG) BY MOUTH DAILY 90 tablet 3  . LORazepam (ATIVAN) 2 MG  tablet TAKE 1/2 TO 1 TABLET BY MOUTH AT BEDTIME AS NEEDED FOR INSOMINIA 30 tablet 1  . Lysine 500 MG TABS Take 500 mg by mouth daily.     Marland Kitchen MAGNESIUM CITRATE PO Take 150 mg by mouth 2 (two) times daily.     No current facility-administered medications on file prior to visit.     Review of Systems:  As per HPI- otherwise negative. No fever or chills, no chest pain or shortness of breath  Physical Examination: Vitals:   05/22/19 1302  BP: (!) 147/73  Pulse: 71  Resp: 16  Temp: 97.7 F (36.5 C)  SpO2: 100%   Vitals:   05/22/19 1302  Weight: 164 lb (74.4 kg)  Height: 5' 2.5" (1.588 m)   Body mass index is 29.52 kg/m. Ideal Body Weight: Weight in (lb) to have BMI = 25: 138.6  GEN: WDWN, NAD, Non-toxic, A & O x 3, overweight, looks well HEENT: Atraumatic, Normocephalic. Neck supple. No masses, No LAD.  TM within normal limits bilaterally Ears and Nose: No external deformity. CV: RRR, No M/G/R. No JVD. No thrill. No extra heart sounds. PULM: CTA B, no wheezes, crackles, rhonchi. No retractions. No resp. distress. No accessory muscle use. ABD: S, NT, ND. No rebound. No HSM. EXTR: No c/c/e NEURO Normal gait.  PSYCH: Normally interactive. Conversant. Not depressed or anxious appearing.  Calm demeanor.    Assessment and Plan: Essential hypertension - Plan: lisinopril (ZESTRIL) 20 MG tablet, lisinopril (ZESTRIL) 10 MG tablet  Insomnia, unspecified type  Bulging lumbar disc  Screening for hyperlipidemia  Screening for deficiency anemia  Screening for diabetes mellitus  History of hypertension.  She is currently taking 20 mg of lisinopril sometimes, and 30 mg when her blood pressure is higher.  She thinks she is taking 30 mg more than half of the time.  Prescribed her both 20 and 10 mg (per her preference) so she can take 20 or 30 mg daily as needed.  Advised if her systolic pressures are running above 150 we would likely need to increase to 40 mg of lisinopril  She  declines labs and routine immunizations today Does not need a refill of her lorazepam today  Signed Lamar Blinks, MD

## 2019-05-22 ENCOUNTER — Other Ambulatory Visit: Payer: Self-pay

## 2019-05-22 ENCOUNTER — Ambulatory Visit (INDEPENDENT_AMBULATORY_CARE_PROVIDER_SITE_OTHER): Payer: Medicare Other | Admitting: Family Medicine

## 2019-05-22 ENCOUNTER — Encounter: Payer: Self-pay | Admitting: Family Medicine

## 2019-05-22 VITALS — BP 147/73 | HR 71 | Temp 97.7°F | Resp 16 | Ht 62.5 in | Wt 164.0 lb

## 2019-05-22 DIAGNOSIS — G4700 Insomnia, unspecified: Secondary | ICD-10-CM | POA: Diagnosis not present

## 2019-05-22 DIAGNOSIS — M5126 Other intervertebral disc displacement, lumbar region: Secondary | ICD-10-CM | POA: Diagnosis not present

## 2019-05-22 DIAGNOSIS — I10 Essential (primary) hypertension: Secondary | ICD-10-CM

## 2019-05-22 DIAGNOSIS — M5136 Other intervertebral disc degeneration, lumbar region: Secondary | ICD-10-CM

## 2019-05-22 MED ORDER — LISINOPRIL 10 MG PO TABS: ORAL_TABLET | ORAL | 3 refills | Status: DC

## 2019-05-22 MED ORDER — LISINOPRIL 20 MG PO TABS: ORAL_TABLET | ORAL | 3 refills | Status: DC

## 2019-05-22 NOTE — Patient Instructions (Addendum)
Great to see you again today, please take 20 or 30 mg of lisinopril daily for your BP We may have to live with systolic BP in the [phone] so that we don't push your diastolic BP too low.  However if your top numbers are running 150- 160 let me know   Please do come and see me for labs when you feel comfortable

## 2019-07-20 ENCOUNTER — Telehealth: Payer: Self-pay | Admitting: Family Medicine

## 2019-07-20 NOTE — Telephone Encounter (Signed)
Spoke with Mrs. Rohrer regarding AWV. Patient stated that someone from her insurance completed a the wellness visit with her. She declined to schedule AWV at this time.

## 2019-07-21 IMAGING — DX DG HIP (WITH OR WITHOUT PELVIS) 2-3V*R*
3 series · 3 of 3 positions shown · non-contrast
Comparison: None in PACs

CLINICAL DATA: Right hip pain since July 2017.

EXAM:
DG HIP (WITH OR WITHOUT PELVIS) 2-3V RIGHT

[pelvis ap]
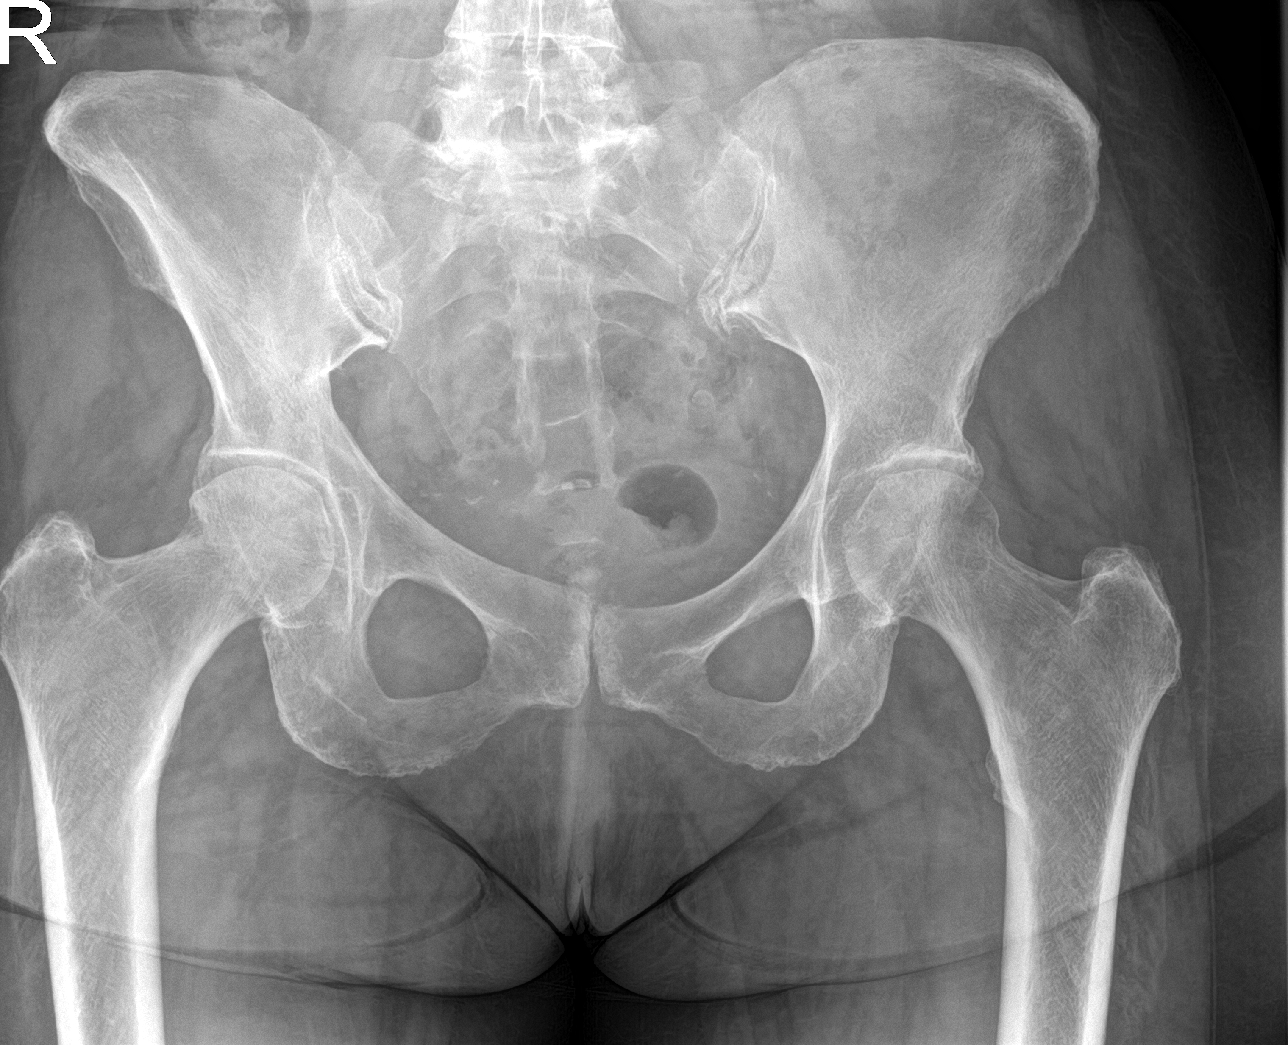

[hip ap]
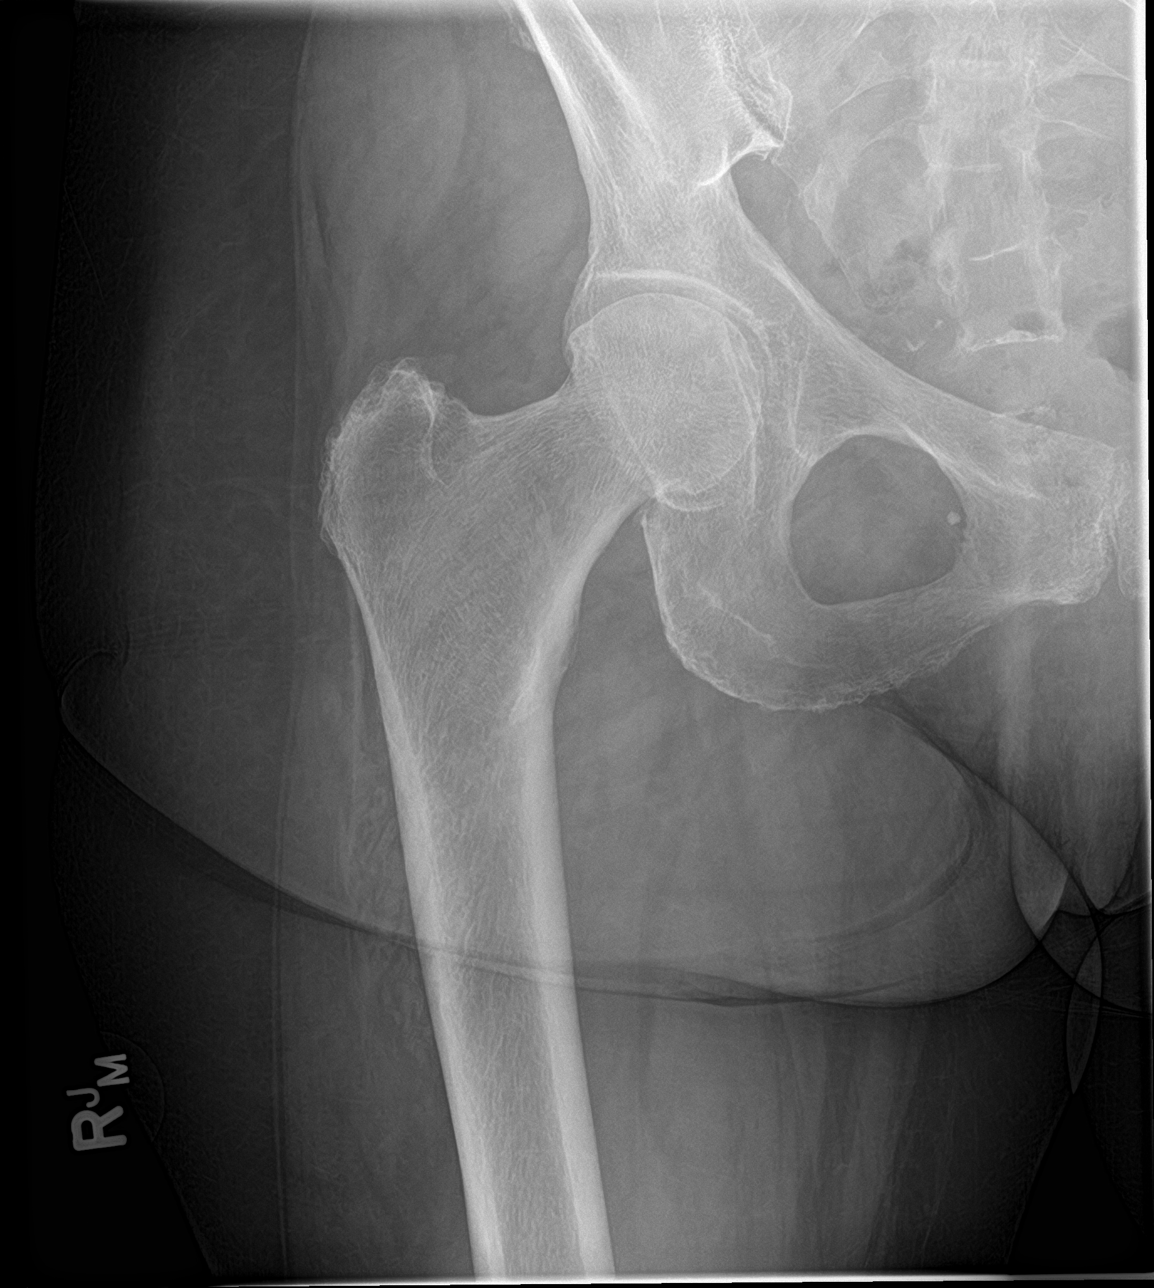

[hip frog leg]
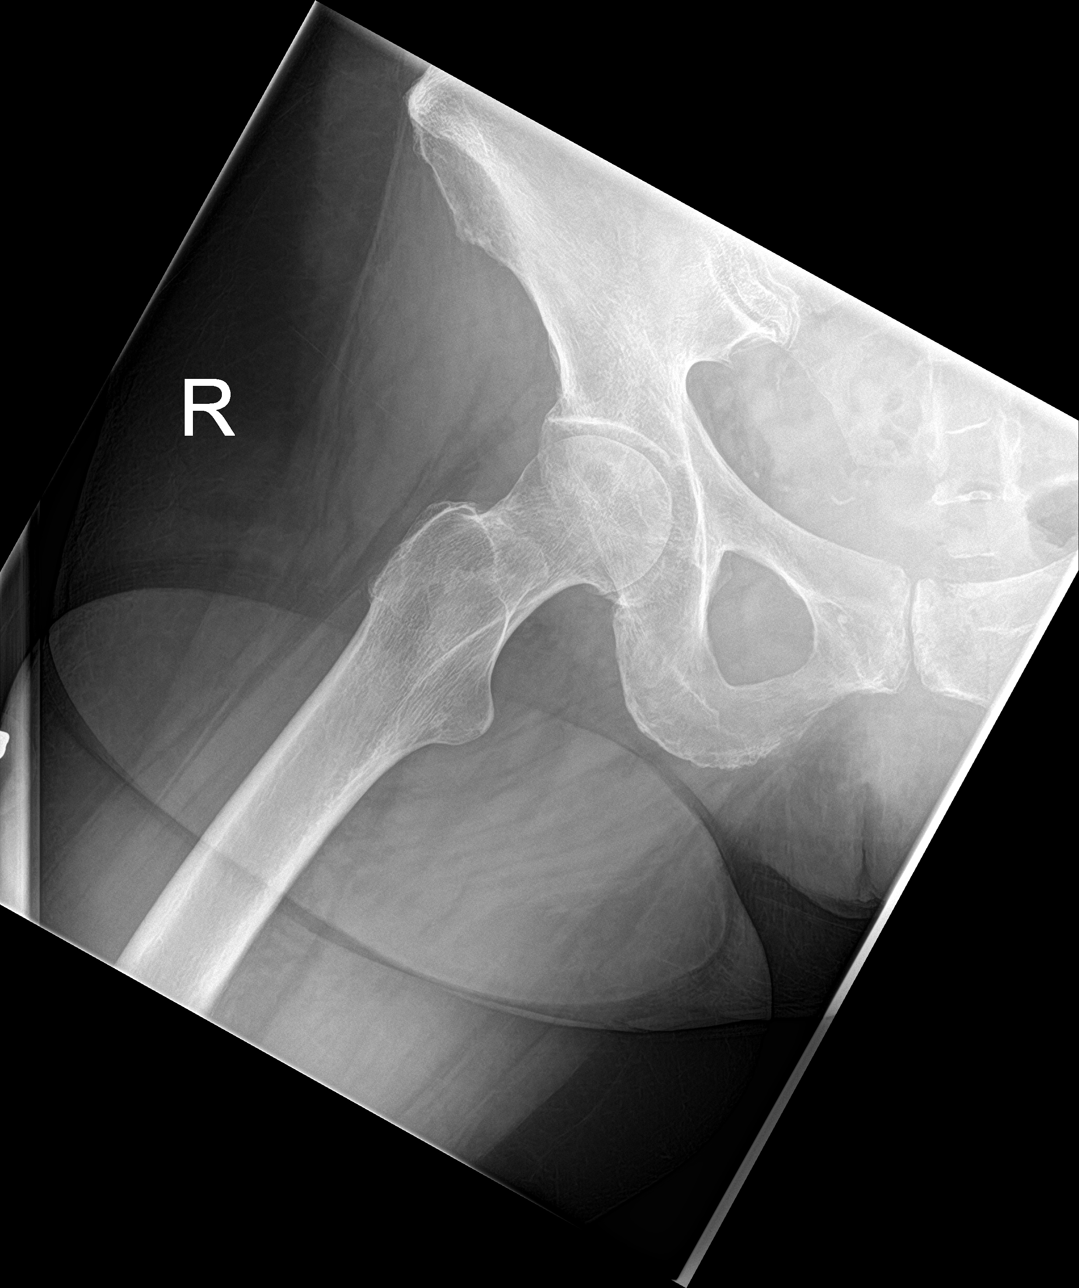

[3 of 3 positions shown; findings below may reference images not displayed]

FINDINGS: The bones are subjectively adequately mineralized. There is subtle
lucency projecting over the femoral head and superior - lateral
aspect of the acetabulum demonstrated best on the frog-leg lateral
view. There is mild asymmetric joint space loss of the right hip.
There is no acute fracture or dislocation.
IMPRESSION: There is mild asymmetric joint space loss consistent with
osteoarthritis. There is lucency projecting over the femoral head
and acetabulum which could reflect a lytic lesion in the appropriate
clinical setting. MRI or CT scanning of the right hip is recommended
to exclude occult pathology.

## 2019-09-28 ENCOUNTER — Ambulatory Visit (INDEPENDENT_AMBULATORY_CARE_PROVIDER_SITE_OTHER): Payer: Medicare Other | Admitting: Family Medicine

## 2019-09-28 ENCOUNTER — Other Ambulatory Visit: Payer: Self-pay

## 2019-09-28 ENCOUNTER — Encounter: Payer: Self-pay | Admitting: Family Medicine

## 2019-09-28 VITALS — BP 126/78 | HR 74 | Temp 96.5°F | Resp 17 | Ht 62.5 in | Wt 164.0 lb

## 2019-09-28 DIAGNOSIS — G4700 Insomnia, unspecified: Secondary | ICD-10-CM | POA: Diagnosis not present

## 2019-09-28 DIAGNOSIS — R300 Dysuria: Secondary | ICD-10-CM | POA: Diagnosis not present

## 2019-09-28 LAB — POCT URINALYSIS DIP (MANUAL ENTRY)
Bilirubin, UA: NEGATIVE
Blood, UA: NEGATIVE
Glucose, UA: NEGATIVE mg/dL
Ketones, POC UA: NEGATIVE mg/dL
Nitrite, UA: NEGATIVE
Protein Ur, POC: NEGATIVE mg/dL
Spec Grav, UA: 1.01 (ref 1.010–1.025)
Urobilinogen, UA: 0.2 E.U./dL
pH, UA: 6.5 (ref 5.0–8.0)

## 2019-09-28 MED ORDER — AMOXICILLIN-POT CLAVULANATE 500-125 MG PO TABS: 1.0000 | ORAL_TABLET | Freq: Two times a day (BID) | ORAL | 0 refills | Status: DC

## 2019-09-28 MED ORDER — LORAZEPAM 2 MG PO TABS: ORAL_TABLET | ORAL | 0 refills | Status: AC

## 2019-09-28 MED ORDER — AMOXICILLIN-POT CLAVULANATE 500-125 MG PO TABS: 1.0000 | ORAL_TABLET | Freq: Three times a day (TID) | ORAL | 0 refills | Status: DC

## 2019-09-28 MED FILL — AMOX-CLAV 500-125 MG TABLET: 500-125 | 7 days supply | Qty: 14 | Fill #0

## 2019-09-28 MED FILL — LORazepam 2 MG TABS: 2 | 30 days supply | Qty: 30 | Fill #0

## 2019-09-28 NOTE — Patient Instructions (Addendum)
It was great to see you again today, please me posted about your progress I will be in touch with your urine culture asap We will use Augmentin antibiotic twice a day for one week  Please let me know if you are getting worse or have any other concerns  I would suggest a barrier cream such as desitin around the vaginal opening for a few days to help with irritation

## 2019-09-28 NOTE — Progress Notes (Addendum)
[facility] [place], [place], [place] [address] [phone]  Date:  09/28/2019   Name:  Kristina Richard   DOB:  [birth year]   MRN:  [id number]  PCP:  Darreld Mclean, MD    Chief Complaint: Urinary Tract Infection (pain in vaginal area, incontinence, dysuria, no hematuria  )   History of Present Illness:  Kristina Richard is a 79 y.o. very pleasant female patient who presents with the following:  Patient with history of degenerative joint disease, hypertension and insomnia here today with concern of possible UTI Last seen by myself in November for follow-up visit  She has noticed symptoms of dysuria, difficulty holding her urine- 5 weeks She did see blood in her urine once- she thinks she passed a kidney stone about 3-4 weeks ago She had more intense pain that got better so she thinks this might have been a stone She has a feeling like "my vagina is going to fall out" but has not felt any protrusion or abnormality in the vulvar area.  She does not have to hold anything back with her hand when she uses the bathroom  She did have a temp of about 99 for a week or so several weeks ago-this is when she had more severe pain and hematuria No vomiting Some nausea   Patient Active Problem List   Diagnosis Date Noted  . Osteopenia 04/15/2016  . Essential hypertension 09/26/2015  . Insomnia 09/26/2015  . DJD (degenerative joint disease) of knee 06/24/2015  . Primary localized osteoarthritis of left knee   . Bulging lumbar disc   . Cervical disc disease     Past Medical History:  Diagnosis Date  . Anemia   . Arthritis   . Back pain   . Cataract   . Diverticulosis   . Hemorrhoids   . History of bronchitis 4 yrs ago  . History of colon polyps   . Hypertension   . Insomnia    takes Xanax nightly as needed for insomnia or anxiety  . Joint pain   . Joint swelling   . Nocturia   . Pneumonia    hx of-most  recent 5 yrs ago  . Primary localized osteoarthritis of left knee   . Rash    upper body and states that it nefver goes away.Saw a dermatologist  . Weakness    numbness and tingling in both     Past Surgical History:  Procedure Laterality Date  . APPENDECTOMY    . COLONOSCOPY    . EYE SURGERY Bilateral    cataract  . TONSILLECTOMY    . TOTAL KNEE ARTHROPLASTY Left 06/24/2015   Procedure: TOTAL KNEE ARTHROPLASTY;  Surgeon: Elsie Saas, MD;  Location: [facility];  Service: Orthopedics;  Laterality: Left;    Social History   Tobacco Use  . Smoking status: Never Smoker  . Smokeless tobacco: Never Used  Substance Use Topics  . Alcohol use: No    Alcohol/week: 0.0 standard drinks  . Drug use: No    Family History  Problem Relation Age of Onset  . Cancer Mother   . Hyperlipidemia Father   . Hypertension Father   . Heart disease Brother   . Hypertension Maternal Grandmother     Allergies  Allergen Reactions  . Hydrocodone Other (See Comments)    Reports that if she gets to much she had trouble breathing and blacked out  . Morphine And Related  Doesn't want any morphine and sick to stomach  . Codeine Other (See Comments)    Can do codones like hydrocodone or oxycodone    Medication list has been reviewed and updated.  Current Outpatient Medications on File Prior to Visit  Medication Sig Dispense Refill  . lisinopril (ZESTRIL) 10 MG tablet TAKE 1 TABLET(10 MG) BY MOUTH DAILY.  Take with 20 mg to make 30 mg totay 90 tablet 3  . lisinopril (ZESTRIL) 20 MG tablet Take 20 mg daily for HTN 90 tablet 3  . LORazepam (ATIVAN) 2 MG tablet TAKE 1/2 TO 1 TABLET BY MOUTH AT BEDTIME AS NEEDED FOR INSOMINIA (Patient not taking: Reported on 09/28/2019) 30 tablet 1   No current facility-administered medications on file prior to visit.    Review of Systems:  As per HPI- otherwise negative.Marland Kitchen  Physical Examination: Vitals:   09/28/19 1545  BP: 126/78  Pulse: 74  Resp: 17   Temp: (!) 96.5 F (35.8 C)  SpO2: 97%   Vitals:   09/28/19 1545  Weight: 164 lb (74.4 kg)  Height: 5' 2.5" (1.588 m)   Body mass index is 29.52 kg/m. Ideal Body Weight: Weight in (lb) to have BMI = 25: 138.6  GEN: no acute distress.  Normal weight for age, looks well HEENT: Atraumatic, Normocephalic.  Ears and Nose: No external deformity. CV: RRR, No M/G/R. No JVD. No thrill. No extra heart sounds. PULM: CTA B, no wheezes, crackles, rhonchi. No retractions. No resp. distress. No accessory muscle use. ABD: S, NT, ND, +BS. No rebound. No HSM.  Belly is benign, except she notes pressure over her bladder EXTR: No c/c/e PSYCH: Normally interactive. Conversant.  Limited pelvic exam is normal, normal vulva and vaginal introitus.  No apparent protrusion of pelvic organs with Valsalva  Results for orders placed or performed in visit on 09/28/19  POCT urinalysis dipstick  Result Value Ref Range   Color, UA yellow yellow   Clarity, UA clear clear   Glucose, UA negative negative mg/dL   Bilirubin, UA negative negative   Ketones, POC UA negative negative mg/dL   Spec Grav, UA 1.010 1.010 - 1.025   Blood, UA negative negative   pH, UA 6.5 5.0 - 8.0   Protein Ur, POC negative negative mg/dL   Urobilinogen, UA 0.2 0.2 or 1.0 E.U./dL   Nitrite, UA Negative Negative   Leukocytes, UA Moderate (2+) (A) Negative    Assessment and Plan: Dysuria - Plan: Urine Culture, POCT urinalysis dipstick, amoxicillin-clavulanate (AUGMENTIN) 500-125 MG tablet, DISCONTINUED: amoxicillin-clavulanate (AUGMENTIN) 500-125 MG tablet, DISCONTINUED: amoxicillin-clavulanate (AUGMENTIN) 500-125 MG tablet  Insomnia, unspecified type - Plan: LORazepam (ATIVAN) 2 MG tablet  Here today with likely UTI.  Urine culture is pending, will start her on Augmentin  She also requests a refill of her lorazepam which she uses on occasion-last filled about a year ago  I have encouraged her to drink plenty of fluids, alert me  if not getting better over the next few days-sooner if worse.  If she does not have a UTI, will need to further address episode of hematuria   This visit occurred during the SARS-CoV-2 public health emergency.  Safety protocols were in place, including screening questions prior to the visit, additional usage of staff PPE, and extensive cleaning of exam room while observing appropriate contact time as indicated for disinfecting solutions.    Signed Lamar Blinks, MD  Addendum 3/21, received urine culture E. coli, sensitive to current antibiotic MyChart message to patient  Results for orders placed or performed in visit on 09/28/19  Urine Culture   Specimen: Urine  Result Value Ref Range   MICRO NUMBER: [id number]    SPECIMEN QUALITY: Adequate    Sample Source NOT GIVEN    STATUS: FINAL    ISOLATE 1: Escherichia coli (A)       Susceptibility   Escherichia coli - URINE CULTURE, REFLEX    AMOX/CLAVULANIC <=2 Sensitive     AMPICILLIN <=2 Sensitive     AMPICILLIN/SULBACTAM <=2 Sensitive     CEFAZOLIN* <=4 Not Reportable      * For infections other than uncomplicated UTIcaused by E. coli, K. pneumoniae or P. mirabilis:Cefazolin is resistant if MIC > or = 8 mcg/mL.(Distinguishing susceptible versus intermediatefor isolates with MIC < or = 4 mcg/mL requiresadditional testing.)For uncomplicated UTI caused by E. coli,K. pneumoniae or P. mirabilis: Cefazolin issusceptible if MIC <32 mcg/mL and predictssusceptible to the oral agents cefaclor, cefdinir,cefpodoxime, cefprozil, cefuroxime, cephalexinand loracarbef.    CEFEPIME <=1 Sensitive     CEFTRIAXONE <=1 Sensitive     CIPROFLOXACIN <=0.25 Sensitive     LEVOFLOXACIN <=0.12 Sensitive     ERTAPENEM <=0.5 Sensitive     GENTAMICIN <=1 Sensitive     IMIPENEM <=0.25 Sensitive     NITROFURANTOIN <=16 Sensitive     PIP/TAZO <=4 Sensitive     TOBRAMYCIN <=1 Sensitive     TRIMETH/SULFA* >=320 Resistant      * For infections other than  uncomplicated UTIcaused by E. coli, K. pneumoniae or P. mirabilis:Cefazolin is resistant if MIC > or = 8 mcg/mL.(Distinguishing susceptible versus intermediatefor isolates with MIC < or = 4 mcg/mL requiresadditional testing.)For uncomplicated UTI caused by E. coli,K. pneumoniae or P. mirabilis: Cefazolin issusceptible if MIC <32 mcg/mL and predictssusceptible to the oral agents cefaclor, cefdinir,cefpodoxime, cefprozil, cefuroxime, cephalexinand loracarbef.Legend:S = Susceptible  I = IntermediateR = Resistant  NS = Not susceptible* = Not tested  NR = Not reported**NN = See antimicrobic comments  POCT urinalysis dipstick  Result Value Ref Range   Color, UA yellow yellow   Clarity, UA clear clear   Glucose, UA negative negative mg/dL   Bilirubin, UA negative negative   Ketones, POC UA negative negative mg/dL   Spec Grav, UA 1.010 1.010 - 1.025   Blood, UA negative negative   pH, UA 6.5 5.0 - 8.0   Protein Ur, POC negative negative mg/dL   Urobilinogen, UA 0.2 0.2 or 1.0 E.U./dL   Nitrite, UA Negative Negative   Leukocytes, UA Moderate (2+) (A) Negative

## 2019-09-30 LAB — URINE CULTURE
MICRO NUMBER:: 10266473
SPECIMEN QUALITY:: ADEQUATE

## 2019-10-01 ENCOUNTER — Encounter: Payer: Self-pay | Admitting: Family Medicine

## 2019-10-04 ENCOUNTER — Encounter: Payer: Self-pay | Admitting: Family Medicine

## 2019-10-04 ENCOUNTER — Telehealth: Payer: Self-pay | Admitting: Family Medicine

## 2019-10-04 DIAGNOSIS — R300 Dysuria: Secondary | ICD-10-CM

## 2019-10-04 MED ORDER — AMOXICILLIN-POT CLAVULANATE 500-125 MG PO TABS: 1.0000 | ORAL_TABLET | Freq: Two times a day (BID) | ORAL | 0 refills | Status: DC

## 2019-10-04 NOTE — Telephone Encounter (Signed)
Patient was given augmentin on 09/28/2019

## 2019-10-04 NOTE — Telephone Encounter (Signed)
Pt states that she is requesting for a refill on the antibiotics she was given this week for her UTI. PT is still having discomfort. Thanks

## 2019-10-23 NOTE — Patient Instructions (Signed)
It was great to see you again today, I will be in touch with your labs as soon as possible I would encourage you to get the Shingrix shingles vaccine from your pharmacy, and your covid 19 vaccine!  Take care, let me know if any issues and we can plan to visit in 6 months    Health Maintenance After Age 77 After age 76, you are at a higher risk for certain long-term diseases and infections as well as injuries from falls. Falls are a major cause of broken bones and head injuries in people who are older than age 25. Getting regular preventive care can help to keep you healthy and well. Preventive care includes getting regular testing and making lifestyle changes as recommended by your health care provider. Talk with your health care provider about:  Which screenings and tests you should have. A screening is a test that checks for a disease when you have no symptoms.  A diet and exercise plan that is right for you. What should I know about screenings and tests to prevent falls? Screening and testing are the best ways to find a health problem early. Early diagnosis and treatment give you the best chance of managing medical conditions that are common after age 27. Certain conditions and lifestyle choices may make you more likely to have a fall. Your health care provider may recommend:  Regular vision checks. Poor vision and conditions such as cataracts can make you more likely to have a fall. If you wear glasses, make sure to get your prescription updated if your vision changes.  Medicine review. Work with your health care provider to regularly review all of the medicines you are taking, including over-the-counter medicines. Ask your health care provider about any side effects that may make you more likely to have a fall. Tell your health care provider if any medicines that you take make you feel dizzy or sleepy.  Osteoporosis screening. Osteoporosis is a condition that causes the bones to get weaker.  This can make the bones weak and cause them to break more easily.  Blood pressure screening. Blood pressure changes and medicines to control blood pressure can make you feel dizzy.  Strength and balance checks. Your health care provider may recommend certain tests to check your strength and balance while standing, walking, or changing positions.  Foot health exam. Foot pain and numbness, as well as not wearing proper footwear, can make you more likely to have a fall.  Depression screening. You may be more likely to have a fall if you have a fear of falling, feel emotionally low, or feel unable to do activities that you used to do.  Alcohol use screening. Using too much alcohol can affect your balance and may make you more likely to have a fall. What actions can I take to lower my risk of falls? General instructions  Talk with your health care provider about your risks for falling. Tell your health care provider if: ? You fall. Be sure to tell your health care provider about all falls, even ones that seem minor. ? You feel dizzy, sleepy, or off-balance.  Take over-the-counter and prescription medicines only as told by your health care provider. These include any supplements.  Eat a healthy diet and maintain a healthy weight. A healthy diet includes low-fat dairy products, low-fat (lean) meats, and fiber from whole grains, beans, and lots of fruits and vegetables. Home safety  Remove any tripping hazards, such as rugs, cords, and clutter.  Install safety equipment such as grab bars in bathrooms and safety rails on stairs.  Keep rooms and walkways well-lit. Activity   Follow a regular exercise program to stay fit. This will help you maintain your balance. Ask your health care provider what types of exercise are appropriate for you.  If you need a cane or walker, use it as recommended by your health care provider.  Wear supportive shoes that have nonskid soles. Lifestyle  Do not  drink alcohol if your health care provider tells you not to drink.  If you drink alcohol, limit how much you have: ? 0-1 drink a day for women. ? 0-2 drinks a day for men.  Be aware of how much alcohol is in your drink. In the [place] one drink equals one typical bottle of beer (12 oz), one-half glass of wine (5 oz), or one shot of hard liquor (1 oz).  Do not use any products that contain nicotine or tobacco, such as cigarettes and e-cigarettes. If you need help quitting, ask your health care provider. Summary  Having a healthy lifestyle and getting preventive care can help to protect your health and wellness after age 55.  Screening and testing are the best way to find a health problem early and help you avoid having a fall. Early diagnosis and treatment give you the best chance for managing medical conditions that are more common for people who are older than age 61.  Falls are a major cause of broken bones and head injuries in people who are older than age 42. Take precautions to prevent a fall at home.  Work with your health care provider to learn what changes you can make to improve your health and wellness and to prevent falls. This information is not intended to replace advice given to you by your health care provider. Make sure you discuss any questions you have with your health care provider. Document Revised: 10/20/2018 Document Reviewed: 05/12/2017 Elsevier Patient Education  2020 [employer].

## 2019-10-23 NOTE — Progress Notes (Addendum)
[facility] [address] [place],  [address] [phone]  Date:  10/25/2019   Name:  Kristina Richard   DOB:  [birth year]   MRN:  [id number]  PCP:  Darreld Mclean, MD    Chief Complaint: Annual Exam   History of Present Illness:  Kristina Richard is a 79 y.o. very pleasant female patient who presents with the following:  Patient is here today for a physical exam History of osteopenia, degenerative joint disease of her knee, hypertension Last seen by myself last month for concern of UTI-her urine was positive for E. coli at that time She feels like her abx worked, but is not 100% sure that her sx are resolved. We will re-culture her urine for her today No hematuria She does wear a bladder leak pad She does not note any irritation of her vulva  She takes a total of 30 mg of lisinopril No CP or SOB No PMB  Needs blood work today- she is fasting today Shingrix; she declines today  Covid series; she has not done this as of yet Mammogram up-to-date DEXA scan up-to-date Colonoscopy 2015; she was told to come back at age 89 Patient Active Problem List   Diagnosis Date Noted  . Pre-diabetes 10/25/2019  . Osteopenia 04/15/2016  . Essential hypertension 09/26/2015  . Insomnia 09/26/2015  . DJD (degenerative joint disease) of knee 06/24/2015  . Primary localized osteoarthritis of left knee   . Bulging lumbar disc   . Cervical disc disease     Past Medical History:  Diagnosis Date  . Anemia   . Arthritis   . Back pain   . Cataract   . Diverticulosis   . Hemorrhoids   . History of bronchitis 4 yrs ago  . History of colon polyps   . Hypertension   . Insomnia    takes Xanax nightly as needed for insomnia or anxiety  . Joint pain   . Joint swelling   . Nocturia   . Pneumonia    hx of-most recent 5 yrs ago  . Primary localized osteoarthritis of left knee   . Rash    upper body and states that it  nefver goes away.Saw a dermatologist  . Weakness    numbness and tingling in both     Past Surgical History:  Procedure Laterality Date  . APPENDECTOMY    . COLONOSCOPY    . EYE SURGERY Bilateral    cataract  . TONSILLECTOMY    . TOTAL KNEE ARTHROPLASTY Left 06/24/2015   Procedure: TOTAL KNEE ARTHROPLASTY;  Surgeon: Elsie Saas, MD;  Location: [facility];  Service: Orthopedics;  Laterality: Left;    Social History   Tobacco Use  . Smoking status: Never Smoker  . Smokeless tobacco: Never Used  Substance Use Topics  . Alcohol use: No    Alcohol/week: 0.0 standard drinks  . Drug use: No    Family History  Problem Relation Age of Onset  . Cancer Mother   . Hyperlipidemia Father   . Hypertension Father   . Heart disease Brother   . Hypertension Maternal Grandmother     Allergies  Allergen Reactions  . Hydrocodone Other (See Comments)    Reports that if she gets to much she had trouble breathing and blacked out  . Morphine And Related     Doesn't want any morphine and sick to stomach  . Codeine Other (See Comments)  Can do codones like hydrocodone or oxycodone    Medication list has been reviewed and updated.  Current Outpatient Medications on File Prior to Visit  Medication Sig Dispense Refill  . lisinopril (ZESTRIL) 10 MG tablet TAKE 1 TABLET(10 MG) BY MOUTH DAILY.  Take with 20 mg to make 30 mg totay 90 tablet 3  . lisinopril (ZESTRIL) 20 MG tablet Take 20 mg daily for HTN 90 tablet 3  . LORazepam (ATIVAN) 2 MG tablet TAKE 1/2 TO 1 TABLET BY MOUTH AT BEDTIME AS NEEDED FOR INSOMINIA 30 tablet 0   No current facility-administered medications on file prior to visit.    Review of Systems:  As per HPI- otherwise negative.   Physical Examination: Vitals:   10/25/19 0819  BP: 130/82  Pulse: 66  Resp: 16  Temp: (!) 97 F (36.1 C)  SpO2: 100%   Vitals:   10/25/19 0819  Weight: 158 lb (71.7 kg)  Height: 5' 2.5" (1.588 m)   Body mass index is 28.44  kg/m. Ideal Body Weight: Weight in (lb) to have BMI = 25: 138.6  GEN: no acute distress.  Looks well and younger than age  22: Atraumatic, Normocephalic.  Ears and Nose: No external deformity. CV: RRR, No M/G/R. No JVD. No thrill. No extra heart sounds. PULM: CTA B, no wheezes, crackles, rhonchi. No retractions. No resp. distress. No accessory muscle use. ABD: S, NT, ND, +BS. No rebound. No HSM. EXTR: No c/c/e PSYCH: Normally interactive. Conversant.   Assessment and Plan: Physical exam  Essential hypertension - Plan: CBC, Comprehensive metabolic panel  Screening for diabetes mellitus - Plan: Comprehensive metabolic panel, Hemoglobin A1c  Screening for hyperlipidemia - Plan: Lipid panel  Screening for thyroid disorder - Plan: TSH  Screening for deficiency anemia - Plan: CBC  E. coli UTI - Plan: Urine Culture  Pre-diabetes  Here today for a CPE Generally doing well Her BP is under good control Screening is UTD Discussed covid and shingles vaccines Encourage exercise She does not smoke or use alcohol She continues to note some occasional RUQ pain- this has been present for about 3 years now, she has undergone [place] in 2018 and [place] in 2019.  Offered to repeat RUQ [place] today- she declines for now, would like to discuss how this is at our next visit in 6 months  This visit occurred during the SARS-CoV-2 public health emergency.  Safety protocols were in place, including screening questions prior to the visit, additional usage of staff PPE, and extensive cleaning of exam room while observing appropriate contact time as indicated for disinfecting solutions.    Signed Lamar Blinks, MD  Received her labs as below, message to pt  Results for orders placed or performed in visit on 10/25/19  Urine Culture   Specimen: Urine  Result Value Ref Range   MICRO NUMBER: [id number]    SPECIMEN QUALITY: Adequate    Sample Source NOT GIVEN    STATUS: FINAL    ISOLATE 1: Staphylococcus  epidermidis (A)       Susceptibility   Staphylococcus epidermidis - URINE CULTURE POSITIVE 1    VANCOMYCIN 1 Sensitive     CIPROFLOXACIN <=0.5 Sensitive     LEVOFLOXACIN <=0.12 Sensitive     GENTAMICIN <=0.5 Sensitive     NITROFURANTOIN <=16 Sensitive     OXACILLIN* NR Resistant      * Oxacillin-resistant staphylococci are resistant toall currently available beta-lactam antimicrobialagents including penicillins, beta lactam/beta-lactamase inhibitor combinations, and cephems withstaphylococcal indications, including Cefazolin.  TETRACYCLINE <=1 Sensitive     TRIMETH/SULFA* <=10 Sensitive      * Oxacillin-resistant staphylococci are resistant toall currently available beta-lactam antimicrobialagents including penicillins, beta lactam/beta-lactamase inhibitor combinations, and cephems withstaphylococcal indications, including Cefazolin.Legend:S = Susceptible  I = IntermediateR = Resistant  NS = Not susceptible* = Not tested  NR = Not reported**NN = See antimicrobic comments  CBC  Result Value Ref Range   WBC 6.4 4.0 - 10.5 K/uL   RBC 4.84 3.87 - 5.11 Mil/uL   Platelets 406.0 (H) 150.0 - 400.0 K/uL   Hemoglobin 13.8 12.0 - 15.0 g/dL   HCT 41.1 36.0 - 46.0 %   MCV 84.9 78.0 - 100.0 fl   MCHC 33.5 30.0 - 36.0 g/dL   RDW 13.0 11.5 - 15.5 %  Comprehensive metabolic panel  Result Value Ref Range   Sodium 135 135 - 145 mEq/L   Potassium 3.9 3.5 - 5.1 mEq/L   Chloride 96 96 - 112 mEq/L   CO2 32 19 - 32 mEq/L   Glucose, Bld 96 70 - 99 mg/dL   BUN 12 6 - 23 mg/dL   Creatinine, Ser 0.70 0.40 - 1.20 mg/dL   Total Bilirubin 1.0 0.2 - 1.2 mg/dL   Alkaline Phosphatase 58 39 - 117 U/L   AST 16 0 - 37 U/L   ALT 11 0 - 35 U/L   Total Protein 6.5 6.0 - 8.3 g/dL   Albumin 4.3 3.5 - 5.2 g/dL   GFR 81.14 >60.00 mL/min   Calcium 9.6 8.4 - 10.5 mg/dL  Hemoglobin A1c  Result Value Ref Range   Hgb A1c MFr Bld 5.9 4.6 - 6.5 %  Lipid panel  Result Value Ref Range   Cholesterol 221 (H) 0 - 200 mg/dL    Triglycerides 69.0 0.0 - 149.0 mg/dL   HDL 51.80 >39.00 mg/dL   VLDL 13.8 0.0 - 40.0 mg/dL   LDL Cholesterol 155 (H) 0 - 99 mg/dL   Total CHOL/HDL Ratio 4    NonHDL 169.22   TSH  Result Value Ref Range   TSH 1.17 0.35 - 4.50 uIU/mL     Thyroid normal Blood counts normal Metabolic profile normal Your A1c- average blood sugar- is in the PREdiabetes range.  This means you may be at higher risk for developing diabetes later on.  We will continue to monitor this Your cholesterol is a bit higher than I would like to see- a cholesterol medication may help reduce your risk of heart attack or stroke.  Please let me know if you would be interested in starting a cholesterol medication. In any case, please see me in 6 months   addnd 4/16- received urine culture which is positive for staph as above This should respond to macrobid which I will call in for her  Her creatinine clearance is over 70, adequate to use Macrobid

## 2019-10-24 ENCOUNTER — Other Ambulatory Visit: Payer: Self-pay

## 2019-10-25 ENCOUNTER — Encounter: Payer: Self-pay | Admitting: Family Medicine

## 2019-10-25 ENCOUNTER — Ambulatory Visit (INDEPENDENT_AMBULATORY_CARE_PROVIDER_SITE_OTHER): Payer: Medicare Other | Admitting: Family Medicine

## 2019-10-25 ENCOUNTER — Other Ambulatory Visit: Payer: Self-pay

## 2019-10-25 VITALS — BP 130/82 | HR 66 | Temp 97.0°F | Resp 16 | Ht 62.5 in | Wt 158.0 lb

## 2019-10-25 DIAGNOSIS — Z1329 Encounter for screening for other suspected endocrine disorder: Secondary | ICD-10-CM

## 2019-10-25 DIAGNOSIS — Z13220 Encounter for screening for lipoid disorders: Secondary | ICD-10-CM | POA: Diagnosis not present

## 2019-10-25 DIAGNOSIS — I10 Essential (primary) hypertension: Secondary | ICD-10-CM | POA: Diagnosis not present

## 2019-10-25 DIAGNOSIS — Z0000 Encounter for general adult medical examination without abnormal findings: Secondary | ICD-10-CM

## 2019-10-25 DIAGNOSIS — R7303 Prediabetes: Secondary | ICD-10-CM | POA: Insufficient documentation

## 2019-10-25 DIAGNOSIS — Z130 Encounter for screening for diseases of the blood and blood-forming organs and certain disorders involving the immune mechanism: Secondary | ICD-10-CM | POA: Diagnosis not present

## 2019-10-25 DIAGNOSIS — B9620 Unspecified Escherichia coli [E. coli] as the cause of diseases classified elsewhere: Secondary | ICD-10-CM | POA: Diagnosis not present

## 2019-10-25 DIAGNOSIS — N390 Urinary tract infection, site not specified: Secondary | ICD-10-CM | POA: Diagnosis not present

## 2019-10-25 DIAGNOSIS — Z131 Encounter for screening for diabetes mellitus: Secondary | ICD-10-CM

## 2019-10-25 LAB — COMPREHENSIVE METABOLIC PANEL
ALT: 11 U/L (ref 0–35)
AST: 16 U/L (ref 0–37)
Albumin: 4.3 g/dL (ref 3.5–5.2)
Alkaline Phosphatase: 58 U/L (ref 39–117)
BUN: 12 mg/dL (ref 6–23)
CO2: 32 mEq/L (ref 19–32)
Calcium: 9.6 mg/dL (ref 8.4–10.5)
Chloride: 96 mEq/L (ref 96–112)
Creatinine, Ser: 0.7 mg/dL (ref 0.40–1.20)
GFR: 81.14 mL/min (ref >60.00)
Glucose, Bld: 96 mg/dL (ref 70–99)
Potassium: 3.9 mEq/L (ref 3.5–5.1)
Sodium: 135 mEq/L (ref 135–145)
Total Bilirubin: 1 mg/dL (ref 0.2–1.2)
Total Protein: 6.5 g/dL (ref 6.0–8.3)

## 2019-10-25 LAB — CBC
HCT: 41.1 % (ref 36.0–46.0)
Hemoglobin: 13.8 g/dL (ref 12.0–15.0)
MCHC: 33.5 g/dL (ref 30.0–36.0)
MCV: 84.9 fl (ref 78.0–100.0)
Platelets: 406 10*3/uL — ABNORMAL HIGH (ref 150.0–400.0)
RBC: 4.84 Mil/uL (ref 3.87–5.11)
RDW: 13 % (ref 11.5–15.5)
WBC: 6.4 10*3/uL (ref 4.0–10.5)

## 2019-10-25 LAB — HEMOGLOBIN A1C: Hgb A1c MFr Bld: 5.9 % (ref 4.6–6.5)

## 2019-10-25 LAB — LIPID PANEL
Cholesterol: 221 mg/dL — ABNORMAL HIGH (ref 0–200)
HDL: 51.8 mg/dL (ref >39.00)
LDL Cholesterol: 155 mg/dL — ABNORMAL HIGH (ref 0–99)
NonHDL: 169.22
Total CHOL/HDL Ratio: 4
Triglycerides: 69 mg/dL (ref 0.0–149.0)
VLDL: 13.8 mg/dL (ref 0.0–40.0)

## 2019-10-25 LAB — TSH: TSH: 1.17 u[IU]/mL (ref 0.35–4.50)

## 2019-10-27 LAB — URINE CULTURE
MICRO NUMBER:: 10362552
SPECIMEN QUALITY:: ADEQUATE

## 2019-10-27 MED ORDER — NITROFURANTOIN MONOHYD MACRO 100 MG PO CAPS: 100.0000 mg | ORAL_CAPSULE | Freq: Two times a day (BID) | ORAL | 0 refills | Status: DC

## 2019-10-27 NOTE — Addendum Note (Signed)
Addended by: Lamar Blinks C on: 10/27/2019 04:57 PM   Modules accepted: Orders

## 2019-11-13 DIAGNOSIS — R339 Retention of urine, unspecified: Secondary | ICD-10-CM | POA: Diagnosis not present

## 2019-11-13 DIAGNOSIS — N390 Urinary tract infection, site not specified: Secondary | ICD-10-CM | POA: Diagnosis not present

## 2019-12-25 ENCOUNTER — Encounter (HOSPITAL_BASED_OUTPATIENT_CLINIC_OR_DEPARTMENT_OTHER): Payer: Self-pay

## 2019-12-25 ENCOUNTER — Telehealth: Payer: Self-pay | Admitting: Family Medicine

## 2019-12-25 ENCOUNTER — Emergency Department (HOSPITAL_BASED_OUTPATIENT_CLINIC_OR_DEPARTMENT_OTHER): Payer: Medicare Other

## 2019-12-25 ENCOUNTER — Other Ambulatory Visit: Payer: Self-pay

## 2019-12-25 ENCOUNTER — Emergency Department (HOSPITAL_BASED_OUTPATIENT_CLINIC_OR_DEPARTMENT_OTHER)
Admission: EM | Admit: 2019-12-25 | Discharge: 2019-12-25 | Disposition: A | Discharge status: Home / Self Care | Payer: Medicare Other | Attending: Emergency Medicine | Admitting: Emergency Medicine

## 2019-12-25 DIAGNOSIS — Z86010 Personal history of colonic polyps: Secondary | ICD-10-CM | POA: Diagnosis not present

## 2019-12-25 DIAGNOSIS — I10 Essential (primary) hypertension: Secondary | ICD-10-CM | POA: Diagnosis not present

## 2019-12-25 DIAGNOSIS — R110 Nausea: Secondary | ICD-10-CM | POA: Diagnosis not present

## 2019-12-25 DIAGNOSIS — Z96652 Presence of left artificial knee joint: Secondary | ICD-10-CM | POA: Diagnosis not present

## 2019-12-25 DIAGNOSIS — R42 Dizziness and giddiness: Secondary | ICD-10-CM | POA: Insufficient documentation

## 2019-12-25 DIAGNOSIS — Z79899 Other long term (current) drug therapy: Secondary | ICD-10-CM | POA: Insufficient documentation

## 2019-12-25 DIAGNOSIS — R079 Chest pain, unspecified: Secondary | ICD-10-CM | POA: Insufficient documentation

## 2019-12-25 DIAGNOSIS — R05 Cough: Secondary | ICD-10-CM | POA: Diagnosis not present

## 2019-12-25 DIAGNOSIS — R509 Fever, unspecified: Secondary | ICD-10-CM | POA: Diagnosis not present

## 2019-12-25 DIAGNOSIS — E876 Hypokalemia: Secondary | ICD-10-CM | POA: Insufficient documentation

## 2019-12-25 DIAGNOSIS — R0789 Other chest pain: Secondary | ICD-10-CM | POA: Diagnosis not present

## 2019-12-25 DIAGNOSIS — R7303 Prediabetes: Secondary | ICD-10-CM | POA: Diagnosis not present

## 2019-12-25 LAB — CBC WITH DIFFERENTIAL/PLATELET
Abs Immature Granulocytes: 0.03 10*3/uL (ref 0.00–0.07)
Basophils Absolute: 0.1 10*3/uL (ref 0.0–0.1)
Basophils Relative: 1 %
Eosinophils Absolute: 0.2 10*3/uL (ref 0.0–0.5)
Eosinophils Relative: 2 %
HCT: 39.1 % (ref 36.0–46.0)
Hemoglobin: 13.2 g/dL (ref 12.0–15.0)
Immature Granulocytes: 0 %
Lymphocytes Relative: 27 %
Lymphs Abs: 2.8 10*3/uL (ref 0.7–4.0)
MCH: 28.4 pg (ref 26.0–34.0)
MCHC: 33.8 g/dL (ref 30.0–36.0)
MCV: 84.1 fL (ref 80.0–100.0)
Monocytes Absolute: 1 10*3/uL (ref 0.1–1.0)
Monocytes Relative: 10 %
Neutro Abs: 6.1 10*3/uL (ref 1.7–7.7)
Neutrophils Relative %: 60 %
Platelets: 402 10*3/uL — ABNORMAL HIGH (ref 150–400)
RBC: 4.65 MIL/uL (ref 3.87–5.11)
RDW: 12.6 % (ref 11.5–15.5)
WBC: 10.2 10*3/uL (ref 4.0–10.5)
nRBC: 0 % (ref 0.0–0.2)

## 2019-12-25 LAB — URINALYSIS, ROUTINE W REFLEX MICROSCOPIC
Bilirubin Urine: NEGATIVE
Glucose, UA: NEGATIVE mg/dL
Hgb urine dipstick: NEGATIVE
Ketones, ur: NEGATIVE mg/dL
Leukocytes,Ua: NEGATIVE
Nitrite: NEGATIVE
Protein, ur: NEGATIVE mg/dL
Specific Gravity, Urine: 1.01 (ref 1.005–1.030)
pH: 6.5 (ref 5.0–8.0)

## 2019-12-25 LAB — COMPREHENSIVE METABOLIC PANEL
ALT: 14 U/L (ref 0–44)
AST: 19 U/L (ref 15–41)
Albumin: 4.3 g/dL (ref 3.5–5.0)
Alkaline Phosphatase: 61 U/L (ref 38–126)
Anion gap: 11 (ref 5–15)
BUN: 17 mg/dL (ref 8–23)
CO2: 28 mmol/L (ref 22–32)
Calcium: 9.2 mg/dL (ref 8.9–10.3)
Chloride: 93 mmol/L — ABNORMAL LOW (ref 98–111)
Creatinine, Ser: 0.75 mg/dL (ref 0.44–1.00)
GFR calc Af Amer: >60 mL/min (ref >60)
GFR calc non Af Amer: >60 mL/min (ref >60)
Glucose, Bld: 104 mg/dL — ABNORMAL HIGH (ref 70–99)
Potassium: 3.2 mmol/L — ABNORMAL LOW (ref 3.5–5.1)
Sodium: 132 mmol/L — ABNORMAL LOW (ref 135–145)
Total Bilirubin: 0.9 mg/dL (ref 0.3–1.2)
Total Protein: 7.5 g/dL (ref 6.5–8.1)

## 2019-12-25 LAB — TROPONIN I (HIGH SENSITIVITY): Troponin I (High Sensitivity): 5 ng/L (ref <18)

## 2019-12-25 MED ORDER — POTASSIUM CHLORIDE ER 10 MEQ PO TBCR
10.0000 meq | EXTENDED_RELEASE_TABLET | Freq: Two times a day (BID) | ORAL | 0 refills | Status: DC
Start: 2019-12-25 — End: 2022-07-29

## 2019-12-25 NOTE — ED Triage Notes (Signed)
Intermittent chest pressure, lightheadedness, nausea x 2-3 weeks.

## 2019-12-25 NOTE — Telephone Encounter (Signed)
Caller: Caleen Call back phone number: [phone]  Patient is experiencing some weakness, light-headed, nausea, flush feeling.  Offer patient an appointment patient decline stating she would like to speak with the assistant to advise.

## 2019-12-25 NOTE — Telephone Encounter (Signed)
Called patient Kristina Richard states for a Couple of weeks  Kristina Richard has been experiencing dizziness, light headedness, nausea, pressure on upper chest, and while that happens Kristina Richard gets very red and feels flushed. Patient states on Friday at home Kristina Richard had one of these spells. Kristina Richard thought possibly her blood sugar was low. Kristina Richard ate a piece of chocolate then checked her sugar. It was 106 after eating. Kristina Richard states Kristina Richard has been taking  her blood pressure medication and stated her blood pressure has been good but doesn't remember the exact reading.   Was able to speak with Dr. Lorelei Pont right away that did agree for patient to go to the ER to be evaluated as soon as Kristina Richard could. Patient agreed and states Kristina Richard will come to our ER downstairs now.

## 2019-12-25 NOTE — ED Provider Notes (Signed)
[facility] EMERGENCY DEPARTMENT Provider Note   CSN: [id number] Arrival date & time: 12/25/19  1738     History Chief Complaint  Patient presents with  . Chest Pain    Kristina Richard is a 79 y.o. female.  HPI Patient presents after discussing with her primary care doctor.  States she has had episodes where she feels lightheadedness nausea and feels a pressure on her chest.  She states she may feel her heart go slow.  Thought that maybe her sugar dropped.  States she took food after one of them was feeling better.  Has had episodes on this for last couple weeks but had them more recently on Friday and Wednesday with today being Monday.  Feeling better now.  Episodes did not come on with exertion.  No fevers.  Has had a little bit of a cough.  States she is had a low-grade temperature also.  States she has not had the Covid vaccine because she is chosen not to.  Also refuses Covid testing.  States she had some symptoms last year that they treated her as Covid although she did not have a positive test.    Past Medical History:  Diagnosis Date  . Anemia   . Arthritis   . Back pain   . Cataract   . Diverticulosis   . Hemorrhoids   . History of bronchitis 4 yrs ago  . History of colon polyps   . Hypertension   . Insomnia    takes Xanax nightly as needed for insomnia or anxiety  . Joint pain   . Joint swelling   . Nocturia   . Pneumonia    hx of-most recent 5 yrs ago  . Primary localized osteoarthritis of left knee   . Rash    upper body and states that it nefver goes away.Saw a dermatologist  . Weakness    numbness and tingling in both     Patient Active Problem List   Diagnosis Date Noted  . Pre-diabetes 10/25/2019  . Osteopenia 04/15/2016  . Essential hypertension 09/26/2015  . Insomnia 09/26/2015  . DJD (degenerative joint disease) of knee 06/24/2015  . Primary localized osteoarthritis of left knee   . Bulging lumbar disc   . Cervical disc disease      Past Surgical History:  Procedure Laterality Date  . APPENDECTOMY    . COLONOSCOPY    . EYE SURGERY Bilateral    cataract  . TONSILLECTOMY    . TOTAL KNEE ARTHROPLASTY Left 06/24/2015   Procedure: TOTAL KNEE ARTHROPLASTY;  Surgeon: Elsie Saas, MD;  Location: [facility];  Service: Orthopedics;  Laterality: Left;     OB History   No obstetric history on file.     Family History  Problem Relation Age of Onset  . Cancer Mother   . Hyperlipidemia Father   . Hypertension Father   . Heart disease Brother   . Hypertension Maternal Grandmother     Social History   Tobacco Use  . Smoking status: Never Smoker  . Smokeless tobacco: Never Used  Vaping Use  . Vaping Use: Never used  Substance Use Topics  . Alcohol use: No    Alcohol/week: 0.0 standard drinks  . Drug use: No    Home Medications Prior to Admission medications   Medication Sig Start Date End Date Taking? Authorizing Provider  lisinopril (ZESTRIL) 10 MG tablet TAKE 1 TABLET(10 MG) BY MOUTH DAILY.  Take with 20 mg to make 30 mg totay 05/22/19  Copland, Gay Filler, MD  lisinopril (ZESTRIL) 20 MG tablet Take 20 mg daily for HTN 05/22/19   Copland, Gay Filler, MD  LORazepam (ATIVAN) 2 MG tablet TAKE 1/2 TO 1 TABLET BY MOUTH AT BEDTIME AS NEEDED FOR INSOMINIA 09/28/19   Copland, Gay Filler, MD  nitrofurantoin, macrocrystal-monohydrate, (MACROBID) 100 MG capsule Take 1 capsule (100 mg total) by mouth 2 (two) times daily. 10/27/19   Copland, Gay Filler, MD  potassium chloride (KLOR-CON) 10 MEQ tablet Take 1 tablet (10 mEq total) by mouth 2 (two) times daily. 12/25/19   Davonna Belling, MD    Allergies    Hydrocodone, Morphine and related, and Codeine  Review of Systems   Review of Systems  Constitutional: Positive for appetite change and fever.  HENT: Negative for congestion.   Respiratory: Positive for cough.   Cardiovascular: Positive for chest pain.  Gastrointestinal: Positive for nausea. Negative for diarrhea.   Genitourinary: Negative for flank pain.  Musculoskeletal: Negative for back pain.  Neurological: Positive for weakness.  Psychiatric/Behavioral: Negative for confusion.    Physical Exam Updated Vital Signs BP 132/64   Pulse 64   Temp 98.3 F (36.8 C) (Oral)   Resp 18   Ht 5\' 2"  (1.575 m)   Wt 70.3 kg   SpO2 97%   BMI 28.35 kg/m   Physical Exam Vitals and nursing note reviewed.  HENT:     Head: Normocephalic.  Cardiovascular:     Rate and Rhythm: Normal rate and regular rhythm.  Pulmonary:     Breath sounds: No wheezing, rhonchi or rales.  Chest:     Chest wall: No tenderness.  Abdominal:     Tenderness: There is no abdominal tenderness.  Musculoskeletal:     Right lower leg: No edema.     Left lower leg: No edema.  Skin:    General: Skin is warm.     Capillary Refill: Capillary refill takes less than 2 seconds.  Neurological:     Mental Status: She is alert.     ED Results / Procedures / Treatments   Labs (all labs ordered are listed, but only abnormal results are displayed) Labs Reviewed  COMPREHENSIVE METABOLIC PANEL - Abnormal; Notable for the following components:      Result Value   Sodium 132 (*)    Potassium 3.2 (*)    Chloride 93 (*)    Glucose, Bld 104 (*)    All other components within normal limits  CBC WITH DIFFERENTIAL/PLATELET - Abnormal; Notable for the following components:   Platelets 402 (*)    All other components within normal limits  URINALYSIS, ROUTINE W REFLEX MICROSCOPIC  TROPONIN I (HIGH SENSITIVITY)  TROPONIN I (HIGH SENSITIVITY)    EKG EKG Interpretation  Date/Time:  Monday December 25 2019 17:46:32 EDT Ventricular Rate:  87 PR Interval:    QRS Duration: 93 QT Interval:  390 QTC Calculation: 470 R Axis:   20 Text Interpretation: Sinus rhythm Low voltage, precordial leads Confirmed by Davonna Belling [phone]) on 12/25/2019 6:42:09 PM   Radiology DG Chest Portable 1 View  Result Date: 12/25/2019 CLINICAL DATA:  Chest  pain. EXAM: PORTABLE CHEST 1 VIEW COMPARISON:  None. FINDINGS: The heart size and mediastinal contours are within normal limits. Atherosclerotic calcifications noted within the thoracic aorta. There are degenerative type changes involving both acromioclavicular joints. Both lungs are clear. The visualized skeletal structures are unremarkable. IMPRESSION: 1. No active disease. 2.  Aortic Atherosclerosis (ICD10-I70.0). Electronically Signed   By: Kerby Moors  M.D.   On: 12/25/2019 18:32    Procedures Procedures (including critical care time)  Medications Ordered in ED Medications - No data to display  ED Course  I have reviewed the triage vital signs and the nursing notes.  Pertinent labs & imaging results that were available during my care of the patient were reviewed by me and considered in my medical decision making (see chart for details).    MDM Rules/Calculators/A&P                          Patient presents with lightheadedness chest pressure nausea.  Comes and goes.  States also had low-grade fever.  EKG reassuring.  Troponin negative.  Chest x-ray reassuring.  Doubt cardiac cause.  Not exertional.  Patient refused Covid test.  Will discharge home for outpatient follow-up.  Mild hypokalemia and will supplement.  Final Clinical Impression(s) / ED Diagnoses Final diagnoses:  Nonspecific chest pain  Hypokalemia    Rx / DC Orders ED Discharge Orders         Ordered    potassium chloride (KLOR-CON) 10 MEQ tablet  2 times daily     Discontinue  Reprint     12/25/19 Jonelle Sports, MD 12/25/19 2006

## 2019-12-26 DIAGNOSIS — N390 Urinary tract infection, site not specified: Secondary | ICD-10-CM | POA: Diagnosis not present

## 2019-12-29 ENCOUNTER — Other Ambulatory Visit: Payer: Self-pay

## 2019-12-29 NOTE — Telephone Encounter (Signed)
Could you advise on potassium chloride. Looks like potassium was low at 3.2 on 12/25/2019. Refill appropriate?

## 2019-12-29 NOTE — Telephone Encounter (Signed)
Patient called in to get a prescription refill for potassium chloride (KLOR-CON) 10 MEQ tablet [[id number]]    Please send it to [facility], [address]  [place], [place] [address]  Phone:  [phone] Fax:  [phone]  DEA #:  [id number]

## 2020-02-08 DIAGNOSIS — H01005 Unspecified blepharitis left lower eyelid: Secondary | ICD-10-CM | POA: Diagnosis not present

## 2020-05-27 ENCOUNTER — Encounter: Payer: Medicare Other | Admitting: Family Medicine

## 2020-05-27 DIAGNOSIS — H524 Presbyopia: Secondary | ICD-10-CM | POA: Diagnosis not present

## 2020-05-27 DIAGNOSIS — Z961 Presence of intraocular lens: Secondary | ICD-10-CM | POA: Diagnosis not present

## 2020-05-27 DIAGNOSIS — H04123 Dry eye syndrome of bilateral lacrimal glands: Secondary | ICD-10-CM | POA: Diagnosis not present

## 2020-05-27 DIAGNOSIS — D23122 Other benign neoplasm of skin of left lower eyelid, including canthus: Secondary | ICD-10-CM | POA: Diagnosis not present

## 2020-06-19 ENCOUNTER — Other Ambulatory Visit: Payer: Self-pay | Admitting: Family Medicine

## 2020-06-19 DIAGNOSIS — I10 Essential (primary) hypertension: Secondary | ICD-10-CM

## 2020-08-12 ENCOUNTER — Telehealth: Payer: Self-pay

## 2020-08-12 NOTE — Telephone Encounter (Signed)
Nurse Assessment Nurse: Hilary Hertz, RN, Louanne Belton Date/Time Eilene Ghazi Time): 08/10/2020 7:56:49 PM Confirm and document reason for call. If symptomatic, describe symptoms. ---Caller states she is requesting an antibiotic for her stuffy nose with temp of 97.9. Symptoms have been going on for 2 weeks (07/25/20 started with scratchy throat). Has tried liquids and OTC (Mucinex DM). Complains of dry nose, low energy, low appetite that has gotten better, sneezing. Denies any other sx. Tmax 101 last weekend. Does the patient have any new or worsening symptoms? ---Yes Will a triage be completed? ---Yes Related visit to physician within the last 2 weeks? ---No Does the PT have any chronic conditions? (i.e. diabetes, asthma, this includes High risk factors for pregnancy, etc.) ---No Is this a behavioral health or substance abuse call? ---No Guidelines Guideline Title Affirmed Question Affirmed Notes Nurse Date/Time (Eastern Time) COVID-19 - Diagnosed or Suspected HIGH RISK for severe COVID complications (e.g., weak immune system, age > 84 years, obesity with BMI > 25, pregnant, chronic lung disease or other chronic medical condition) (Exception: Already seen by PCP and no new or worsening symptoms.) Hilary Hertz, RN, Louanne Belton 08/10/2020 8:01:51 PM PLEASE NOTE: All timestamps contained within this report are represented as [place] Standard Time. CONFIDENTIALTY NOTICE: This fax transmission is intended only for the addressee. It contains information that is legally privileged, confidential or otherwise protected from use or disclosure. If you are not the intended recipient, you are strictly prohibited from reviewing, disclosing, copying using or disseminating any of this information or taking any action in reliance on or regarding this information. If you have received this fax in error, please notify us immediately by telephone so that we can arrange for its return to [place]. Phone:  [phone], Toll-Free: [phone], Fax: [phone] Page: 2 of 2 Call Id: [id number] Forrest. Time Eilene Ghazi Time) Disposition Final User 08/10/2020 8:08:34 PM Called On-Call Provider Hilary Hertz, RN, Louanne Belton 08/10/2020 8:11:51 PM Call PCP Now Yes Hilary Hertz, RN, Gate Disagree/Comply Comply Caller Understands Yes PreDisposition Home Care Care Advice Given Per Guideline CALL PCP NOW: * You need to discuss this with your doctor (or NP/PA). * I'll page the on-call provider now. If you haven't heard from the provider (or me) within 30 minutes, call again. * Feeling dehydrated: Drink extra liquids. If the air in your home is dry, use a humidifier. * For fevers above 101 F (38.3 C) take either acetaminophen or ibuprofen. CARE ADVICE given per COVID-19 - DIAGNOSED OR SUSPECTED (Adult) guideline. CALL BACK IF: * You become worse Comments User: Louanne Belton, Hilary Hertz, RN Date/Time Eilene Ghazi Time): 08/10/2020 8:04:27 PM 99.7 temporal User: Louanne Belton, Hilary Hertz, RN Date/Time Eilene Ghazi Time): 08/10/2020 8:11:41 PM Caller made aware of MD instructions. Paging DoctorName Phone DateTime Result/Outcome Message Type Notes Waunita Schooner- MD [phone] 08/10/2020 8:08:34 PM Called On Call Provider - Reached Doctor Paged Waunita Schooner- MD 08/10/2020 8:10:30 PM Spoke with On Call - General Message Result "Be tested and have ER precautions if she's going to get worse. She can reach out to the clinic on Monday for other treatment options"

## 2021-07-08 ENCOUNTER — Telehealth: Payer: Self-pay | Admitting: Family Medicine

## 2021-07-08 NOTE — Telephone Encounter (Signed)
Pamala Hurry- the pts sister is concerned because she has noticed a very drastic change in her behaviors.   3 weeks ago she noticed that she is more shot tempered with her, whereas she used to be very patient given Barbara's health conditions. Last night is when she had a breakdown and stared to explain how she does not feel like she does not fit it any longer and started explaining where insurance policies are, and what she would like done with her cremations afterwards etc. Pamala Hurry would like some guidance on what to do and how to address these changes. Pamala Hurry says her son works at [employer] as well, so this is also conflicting. She also says it seems like the pt has made an end of life checklist that she is actively trying to fulfill.  She is concerned that if she calls 911 that she will no longer trust her since the pt made her swear that she would not make these issues known.

## 2021-07-08 NOTE — Telephone Encounter (Signed)
Spoke with triage nurse. Advised her to tell the patient's sister that if the patient is suicidal she should be seen at [facility] for behavorial health.

## 2021-07-08 NOTE — Telephone Encounter (Signed)
Nurse Assessment Nurse: Nicki Reaper, RN, Malachy Mood Date/Time Eilene Ghazi Time): 07/08/2021 1:43:17 PM Confirm and document reason for call. If symptomatic, describe symptoms. ---Caller states her sister is having Suicidal ideations, caller is not with the patient, patient states if she dies in her sleep she wants nothing done, and that she just wants to go to sleep and does not want to live anymore, she just left to visit her brother, unable to triage, Does the patient have any new or worsening symptoms? ---Yes Will a triage be completed? ---No Select reason for no triage. ---Other Please document clinical information provided and list any resource used. ---Caller not with pt Disp. Time Eilene Ghazi Time) Disposition Final User 07/08/2021 1:38:20 PM Send to Urgent Katha Cabal 07/08/2021 1:52:23 PM Clinical Call Yes Nicki Reaper, RN, Malachy Mood Comments User: Burna Sis, RN Date/Time Eilene Ghazi Time): 07/08/2021 1:52:16 PM Unable to triage, pt is not with caller, warm transferred caller to [facility] in office

## 2021-07-08 NOTE — Telephone Encounter (Signed)
Patient's sister called stating her sister needed to see someone as soon as possible due to a mental health crisis. Sister states patient is in a really bad place and having suicidal ideation. Call was triaged for further assistance.

## 2021-07-08 NOTE — Telephone Encounter (Signed)
FYI: Called the sister back and she placed me on speaker with th pt. I explained that she could go to this UC. Pamala Hurry says her son works at all the [facility] clinics they do not feel comfortable with him having access to her records. I then let them know that she might need go outside of [facility] at this point to get the care she needs since this is an urgent matter. I also let them know that if we were to work her in with another provider that likely she would be sent to the ER anyway d/t her behavioral changes. Pt clearly stated that she is aware and voices understanding.

## 2022-05-07 HISTORY — PX: OTHER SURGICAL HISTORY: SHX169

## 2022-06-12 HISTORY — PX: OTHER SURGICAL HISTORY: SHX169

## 2022-07-16 ENCOUNTER — Other Ambulatory Visit: Payer: Self-pay | Admitting: Pain Medicine

## 2022-07-16 DIAGNOSIS — Z1231 Encounter for screening mammogram for malignant neoplasm of breast: Secondary | ICD-10-CM

## 2022-07-28 ENCOUNTER — Encounter: Payer: Self-pay | Admitting: Cardiology

## 2022-07-28 DIAGNOSIS — R9439 Abnormal result of other cardiovascular function study: Secondary | ICD-10-CM | POA: Insufficient documentation

## 2022-07-28 NOTE — Progress Notes (Signed)
Primary Care Provider: Candida Peeling, PA-C Primary Cardiologist: Dr. Tonia Richard [facility] Interventional Cardiologist: Kristina Hew, MD Electrophysiologist: None  Referring Cardiologist: Dr. Tonia Richard  Clinic Note: Chief Complaint  Patient presents with   New Patient (Initial Visit)    Referred for Interventional Richard Consult - Abnormal Nuclear Stress Test.   ===================================  ASSESSMENT/PLAN   Problem List Items Addressed This Visit       Richard Problems   Angina, class III [facility])    Somewhat concerning as it was a progressively worsening exertional chest discomfort.  At least class III Angina and now confirmed with positive nuclear stress test.  Indication based on stress test results will be to proceed with cardiac catheterization and possible PCI.  I explained to her the other options which would be Coronary CTA but that would still require [place] going for catheterization if that confirms the stress test findings.  Procedure discussed in great detail with the patient and her her sisters.  She agrees to proceed.  See informed consent below.  Plan: Schedule cardiac catheterization for first case on August 07, 2022 Labs drawn today Load aspirin 81 mg x 4 today and then 80 mg daily starting tomorrow. Especially with her having rapid palpitations and likely CAD/angina will add Toprol 25 mg daily. Continue statin      Relevant Medications   atorvastatin (LIPITOR) 20 MG tablet   metoprolol succinate (TOPROL XL) 25 MG 24 hr tablet   aspirin EC 81 MG tablet   Essential hypertension (Chronic)    BP is stable today.  She says is usually lower than this at home.  She is on lisinopril that was recently started and we are starting Toprol today.      Relevant Medications   atorvastatin (LIPITOR) 20 MG tablet   metoprolol succinate (TOPROL XL) 25 MG 24 hr tablet   aspirin EC 81 MG tablet     Other   Abnormal  nuclear stress test    Myoview suggest two-vessel CAD which could either be a large wraparound LAD or LAD and RCA or dominant circumflex. Read as high risk it was indication for cardiac catheterization.  I discussed the results of the stress test and the risks benefits alternatives and indications of cardiac catheterization.  Clear indication high risk stress test to proceed with cath.  After discussion, she agrees.  Informed consent noted below.  Please symptoms worsen, or occur at rest, she would let us know and we can push up the date for cardiac catheterization as an urgent procedure.  Scheduled for 1st Case 1/26 AM - > Load ASA 324 mg tonite & 81 mg daily Start Toprol 25 mg daily Continue statin      Relevant Orders   EKG 95-GLOV   Basic metabolic panel (Completed)   CBC (Completed)   Rapid or irregular heartbeat    Monitor showed PACs and runs of PAT, symptoms are describing.  Cleared her consistent with palpitations and she may have been feeling PVCs and/or PACs.  Start low-dose beta-blocker especially given concern for CAD.  Toprol 25 mg daily started today.      Pre-diabetes - Primary (Chronic)   Relevant Orders   EKG 56-EPPI   Basic metabolic panel (Completed)   CBC (Completed)   ===================================  HPI:    Kristina Richard is a 82 y.o. female with PMH notable for HTN, HLD, Prediabetes and Paroxysmal Atrial Tachycardia (PAT) who is being seen today for the evaluation of  Abnormal Nuclear Stress Test & Class III Angina.  She is seen today at the request of  Dr. Tonia Richard [facility]).  Kristina Richard was seen on 06/29/2022 by Dr. Edson Richard to discuss results of Nuclear Stress Test - ordered for evaluation of episodic chest tightness, DOE & fatigue as well intermittent fast palpitations. Started on atorvastatin 20 mg daily and referred for Interventional Richard Consultation Carotid Dopplers and bilateral lower extremity  arterial Dopplers ordered.  Recent Hospitalizations: none  Reviewed  CV studies:    The following studies were reviewed today: (if available, images/films reviewed: From Epic Chart or Care Everywhere) Adenosine Myoview 05/07/2022 [facility]): 1. Moderate size, mild to moderate severity reversible apical perfusion defect consistent with impaired perfusion in the mid to distal LAD, 2. small to moderate size, mild severity reversible basal to mid inferoseptal defect suggestive of distal RCA ischemia.  3. Normal LV size and function.  EF 61%.  24-hour Holter monitor: Predominant NSR frequent short bursts of PAT (152 episodes) longest was 19 beats at a max rate of 140 bpm, fastest was 3 beats max rate 193 bpm.  Rare PACs.  Interval History:   Kristina Richard presents here today accompanied by her 2 sisters.  She tells me that she has been having the sensation of chest tightness worse with exertion but she feels has been going on for maybe about a year now but is getting progressively worse.  She started to notice it with more vigorous activity about a year ago but is now noticing it for instance pulling garbage strands up on the road.  What she has been able to do was pause rest catch her breath and then keep going but now it is happening with more intensity and more frequency and recurring with less activity.  She feels it in her chest going up into her jaw.  She is also noted that this past week on Wednesday Thursday and Friday she just felt profoundly tired and fatigued and having these episodes of feeling really fast pounding heartbeats when she is trying to sleep at night.  These episodes are not associated with lightheadedness or dizziness or syncope or near syncope.  Is not that her heart is going very fast is just that is beating faster than usual and somewhat irregular.  She denies any resting chest discomfort and has not had any PND, orthopnea or edema.  She is felt somewhat  lightheaded but no syncope or near syncope.  No TIA or amaurosis fugax.  No claudication.   REVIEWED OF SYSTEMS   Review of Systems  Constitutional:  Positive for malaise/fatigue (Has noticed that she is much easier fatigued of late but she had been in the past.). Negative for weight loss (Has put on weight lately.).  HENT:  Negative for congestion.   Respiratory:  Positive for shortness of breath. Negative for cough and sputum production.   Cardiovascular:        Per HPI  Gastrointestinal:  Negative for blood in stool and melena.  Genitourinary:  Negative for hematuria.  Musculoskeletal:  Negative for falls, joint pain and myalgias.  Neurological:  Negative for dizziness and focal weakness.  Endo/Heme/Allergies:  Bruises/bleeds easily.  Psychiatric/Behavioral:  Negative for depression and memory loss. The patient is not nervous/anxious and does not have insomnia.   All other systems reviewed and are negative.   I have reviewed and (if needed) personally updated the patient's problem list, medications, allergies, past medical and surgical history,  social and family history.   PAST MEDICAL HISTORY   Past Medical History:  Diagnosis Date   Anemia    Arthritis    Back pain    Cataract    Diverticulosis    Hemorrhoids    History of bronchitis 4 yrs ago   History of colon polyps    Hypertension    Insomnia    takes Xanax nightly as needed for insomnia or anxiety   Joint pain    Joint swelling    Nocturia    Pneumonia    hx of-most recent 5 yrs ago   Primary localized osteoarthritis of left knee    Rash    upper body and states that it nefver goes away.Saw a dermatologist   Weakness    numbness and tingling in both     PAST SURGICAL HISTORY   Past Surgical History:  Procedure Laterality Date   24-Hour Holter  06/2022   Predominant NSR frequent short bursts of PAT (152 episodes) longest was 19 beats at a max rate of 140 bpm, fastest was 3 beats max rate 193 bpm.  Rare  PACs.   APPENDECTOMY     COLONOSCOPY     EYE SURGERY Bilateral    cataract   Myoview  05/07/2022   [facility]): 1. Moderate size, mild to moderate severity reversible apical perfusion defect consistent with impaired perfusion in the mid to distal LAD, 2. small to moderate size, mild severity reversible basal to mid inferoseptal defect suggestive of distal RCA ischemia.  3. Normal LV size and function.  EF 61%.   TONSILLECTOMY     TOTAL KNEE ARTHROPLASTY Left 06/24/2015   Procedure: TOTAL KNEE ARTHROPLASTY;  Surgeon: Elsie Saas, MD;  Location: [facility];  Service: Orthopedics;  Laterality: Left;    Immunization History  Administered Date(s) Administered   Td 09/26/2015    MEDICATIONS/ALLERGIES   Current Meds  Medication Sig   atorvastatin (LIPITOR) 20 MG tablet Take 20 mg by mouth daily.   lisinopril (ZESTRIL) 20 MG tablet TAKE 1 TABLET(20 MG) BY MOUTH DAILY   LORazepam (ATIVAN) 2 MG tablet TAKE 1/2 TO 1 TABLET BY MOUTH AT BEDTIME AS NEEDED FOR INSOMINIA    Allergies  Allergen Reactions   Hydrocodone Other (See Comments)    Reports that if she gets to much she had trouble breathing and blacked out   Morphine And Related     Doesn't want any morphine and sick to stomach   Codeine Other (See Comments)    Can do codones like hydrocodone or oxycodone    SOCIAL HISTORY/FAMILY HISTORY   Reviewed in Epic:   Social History   Tobacco Use   Smoking status: Never   Smokeless tobacco: Never  Vaping Use   Vaping Use: Never used  Substance Use Topics   Alcohol use: No    Comment: Drinks maybe once or twice a year.   Drug use: No   Social History   Social History Narrative   Not on file  Rare alcohol.  Non-smoker.  Family History  Problem Relation Age of Onset   Cancer Mother    Hyperlipidemia Father    Hypertension Father    Heart disease Brother    Diabetes Mellitus II Brother    Hypertension Maternal Grandmother     OBJCTIVE -PE, EKG, labs   Wt  Readings from Last 3 Encounters:  07/29/22 154 lb 9.6 oz (70.1 kg)  12/25/19 155 lb (70.3 kg)  10/25/19 158 lb (71.7  kg)    Physical Exam: BP 136/74   Pulse 88   Ht '5\' 2"'$  (1.575 m)   Wt 154 lb 9.6 oz (70.1 kg)   SpO2 100%   BMI 28.28 kg/m  Physical Exam Vitals reviewed.  Constitutional:      General: She is not in acute distress.    Appearance: Normal appearance. She is normal weight. She is not ill-appearing or toxic-appearing.  HENT:     Head: Normocephalic and atraumatic.  Eyes:     Extraocular Movements: Extraocular movements intact.     Pupils: Pupils are equal, round, and reactive to light.  Neck:     Vascular: No carotid bruit or JVD.  Cardiovascular:     Rate and Rhythm: Normal rate and regular rhythm. Occasional Extrasystoles are present.    Chest Wall: PMI is not displaced.     Pulses: Normal pulses.     Heart sounds: Normal heart sounds, S1 normal and S2 normal. No murmur heard.    No friction rub. No gallop.  Pulmonary:     Effort: Pulmonary effort is normal. No respiratory distress.     Breath sounds: Normal breath sounds. No wheezing, rhonchi or rales.  Chest:     Chest wall: No tenderness.  Abdominal:     General: Abdomen is flat. Bowel sounds are normal. There is no distension.     Palpations: Abdomen is soft. There is no mass (No HSM or bruit).     Tenderness: There is no abdominal tenderness. There is no guarding or rebound.     Hernia: No hernia is present.  Musculoskeletal:     Cervical back: Normal range of motion and neck supple.  Neurological:     Mental Status: She is alert.      Adult ECG Report  Rate: 88 ;  Rhythm: normal sinus rhythm and low voltage.  Cannot exclude septal MI, age-indeterminate. ;   Narrative Interpretation: Reviewed  Recent Labs:  06/29/2022-reviewed from Dr. Edson Richard Na+ 142, K+ 4.0, Cl- 101, HCO3-33.5, BUN 11, Cr 1.0, Glu 86, Ca2+ 9.7; AST 19, ALT 16, AlkP 75 CBC: W 7.7, H/H 13.9/43.8, Plt 400 TC 230, TG 104, HDL 54,  LDL 155; A1c 5.9%; TSH 2.14 LDL P 1580 (high), small LDL P 378, HDL P 33.6 (both normal)  ================================================== I spent a total of 43 minutes with the patient spent in direct patient consultation.  Additional time spent with chart review  / charting (studies, outside notes, etc): 35 min (17 min pre-charting, 18 min charting) Total Time: 78 min  Current medicines are reviewed at length with the patient today.  (+/- concerns) n/a  Notice: This dictation was prepared with Dragon dictation along with smart phrase technology. Any transcriptional errors that result from this process are unintentional and may not be corrected upon review.   Studies Ordered:  Orders Placed This Encounter  Procedures   Basic metabolic panel   CBC   EKG 12-Lead   Meds ordered this encounter  Medications   metoprolol succinate (TOPROL XL) 25 MG 24 hr tablet    Sig: Take 1 tablet (25 mg total) by mouth daily.    Dispense:  90 tablet    Refill:  2   aspirin EC 81 MG tablet    Sig: Take 1 tablet (81 mg total) by mouth daily. Swallow whole.    Dispense:  90 tablet    Refill:  3    Patient Instructions / Medication Changes & Studies & Tests Ordered  Shared Decision Making/Informed Consent The risks [stroke (1 in 1000), death (1 in 1000), kidney failure [usually temporary] (1 in 500), bleeding (1 in 200), allergic reaction [possibly serious] (1 in 200)], benefits (diagnostic support and management of coronary artery disease) and alternatives of a cardiac catheterization were discussed in detail with Ms. Twiggs and she is willing to proceed.   Patient Instructions  Medication Instructions:  Start taking Aspirin 81 mg daily  ( take 4 tablets today )  Metoprolol succinate 25 mg one tablet daily  *If you need a refill on your cardiac medications before your next appointment, please call your pharmacy*   Lab Work: today  Bmp cbc If you have labs (blood work) drawn today and  your tests are completely normal, you will receive your results only by: [place] (if you have MyChart) OR A paper copy in the mail If you have any lab test that is abnormal or we need to change your treatment, we will call you to review the results.   Testing/Procedures: will be schedule at [facility] physician has requested that you have a cardiac catheterization. Cardiac catheterization is used to diagnose and/or treat various heart conditions. Doctors may recommend this procedure for a number of different reasons. The most common reason is to evaluate chest pain. Chest pain can be a symptom of coronary artery disease (CAD), and cardiac catheterization can show whether plaque is narrowing or blocking your heart's arteries. This procedure is also used to evaluate the valves, as well as measure the blood flow and oxygen levels in different parts of your heart. For further information please visit [email or web address]. Please follow instruction sheet, as given.    Follow-Up: At [facility], you and your health needs are our priority.  As part of our continuing mission to provide you with exceptional heart care, we have created designated Provider Care Teams.  These Care Teams include your primary Cardiologist (physician) and Advanced Practice Providers (APPs -  Physician Assistants and Nurse Practitioners) who all work together to provide you with the care you need, when you need it.     Your next appointment:   1  to 2 week(s) after  Aug 07 2022  The format for your next appointment:   In Person  Provider:   Dr Kristina Richard    Other Instructions    [facility] A DEPT OF [place]. [facility]  [facility] [address] A DEPT OF [place]. [facility] [id number] [place] [address] Dept: [phone] Loc: [place]  07/29/2022  You are scheduled  for a Cardiac Catheterization on Friday, January 26 with Dr. Glenetta Richard.  1. Please arrive at the [facility] (Main Entrance A) at [facility]: [address] [place], [place] [address] at 5:30 AM (This time is two hours before your procedure to ensure your preparation). Free valet parking service is available.   Special note: Every effort is made to have your procedure done on time. Please understand that emergencies sometimes delay scheduled procedures.  2. Diet: Do not eat solid foods after midnight.  The patient may have clear liquids until 5am upon the day of the procedure.  3. Labs: CBC, BMP  -today   4. Medication instructions in preparation for your procedure:   Contrast Allergy: No   Do not  take, Lisinopril (Zestril or Prinivil) Friday, January 26,  On the morning of your procedure, take your Aspirin 81 mg and any morning medicines NOT listed above.  You may use sips of water.  5. Plan for one night stay--bring personal belongings. 6. Bring a current list of your medications and current insurance cards. 7. You MUST have a responsible person to drive you home. 8. Someone MUST be with you the first 24 hours after you arrive home or your discharge will be delayed. 9. Please wear clothes that are easy to get on and off and wear slip-on shoes.  Thank you for allowing [place] to care for you!   -- [facility]     Leonie Man, MD, MS Kristina Richard, M.D., M.S. Interventional Cardiologist  [place]  Pager # [phone] Phone # [phone] [address] [place], [place] [address]   Thank you for choosing [facility] [place]!!

## 2022-07-28 NOTE — H&P (View-Only) (Signed)
Primary Care Provider: Candida Peeling, PA-C Primary Cardiologist: Dr. Tonia Ghent [facility] Interventional Cardiologist: Glenetta Hew, MD Electrophysiologist: None  Referring Cardiologist: Dr. Tonia Ghent  Clinic Note: Chief Complaint  Patient presents with   New Patient (Initial Visit)    Referred for Interventional Cardiology Consult - Abnormal Nuclear Stress Test.   ===================================  ASSESSMENT/PLAN   Problem List Items Addressed This Visit       Cardiology Problems   Angina, class III [facility])    Somewhat concerning as it was a progressively worsening exertional chest discomfort.  At least class III Angina and now confirmed with positive nuclear stress test.  Indication based on stress test results will be to proceed with cardiac catheterization and possible PCI.  I explained to her the other options which would be Coronary CTA but that would still require [place] going for catheterization if that confirms the stress test findings.  Procedure discussed in great detail with the patient and her her sisters.  She agrees to proceed.  See informed consent below.  Plan: Schedule cardiac catheterization for first case on August 07, 2022 Labs drawn today Load aspirin 81 mg x 4 today and then 80 mg daily starting tomorrow. Especially with her having rapid palpitations and likely CAD/angina will add Toprol 25 mg daily. Continue statin      Relevant Medications   atorvastatin (LIPITOR) 20 MG tablet   metoprolol succinate (TOPROL XL) 25 MG 24 hr tablet   aspirin EC 81 MG tablet   Essential hypertension (Chronic)    BP is stable today.  She says is usually lower than this at home.  She is on lisinopril that was recently started and we are starting Toprol today.      Relevant Medications   atorvastatin (LIPITOR) 20 MG tablet   metoprolol succinate (TOPROL XL) 25 MG 24 hr tablet   aspirin EC 81 MG tablet     Other   Abnormal  nuclear stress test    Myoview suggest two-vessel CAD which could either be a large wraparound LAD or LAD and RCA or dominant circumflex. Read as high risk it was indication for cardiac catheterization.  I discussed the results of the stress test and the risks benefits alternatives and indications of cardiac catheterization.  Clear indication high risk stress test to proceed with cath.  After discussion, she agrees.  Informed consent noted below.  Please symptoms worsen, or occur at rest, she would let us know and we can push up the date for cardiac catheterization as an urgent procedure.  Scheduled for 1st Case 1/26 AM - > Load ASA 324 mg tonite & 81 mg daily Start Toprol 25 mg daily Continue statin      Relevant Orders   EKG 76-OTLX   Basic metabolic panel (Completed)   CBC (Completed)   Rapid or irregular heartbeat    Monitor showed PACs and runs of PAT, symptoms are describing.  Cleared her consistent with palpitations and she may have been feeling PVCs and/or PACs.  Start low-dose beta-blocker especially given concern for CAD.  Toprol 25 mg daily started today.      Pre-diabetes - Primary (Chronic)   Relevant Orders   EKG 72-IOMB   Basic metabolic panel (Completed)   CBC (Completed)   ===================================  HPI:    Kristina Richard is a 82 y.o. female with PMH notable for HTN, HLD, Prediabetes and Paroxysmal Atrial Tachycardia (PAT) who is being seen today for the evaluation of  Abnormal Nuclear Stress Test & Class III Angina.  She is seen today at the request of  Dr. Tonia Ghent [facility]).  Kristina Richard was seen on 06/29/2022 by Dr. Edson Snowball to discuss results of Nuclear Stress Test - ordered for evaluation of episodic chest tightness, DOE & fatigue as well intermittent fast palpitations. Started on atorvastatin 20 mg daily and referred for Interventional Cardiology Consultation Carotid Dopplers and bilateral lower extremity  arterial Dopplers ordered.  Recent Hospitalizations: none  Reviewed  CV studies:    The following studies were reviewed today: (if available, images/films reviewed: From Epic Chart or Care Everywhere) Adenosine Myoview 05/07/2022 [facility]): 1. Moderate size, mild to moderate severity reversible apical perfusion defect consistent with impaired perfusion in the mid to distal LAD, 2. small to moderate size, mild severity reversible basal to mid inferoseptal defect suggestive of distal RCA ischemia.  3. Normal LV size and function.  EF 61%.  24-hour Holter monitor: Predominant NSR frequent short bursts of PAT (152 episodes) longest was 19 beats at a max rate of 140 bpm, fastest was 3 beats max rate 193 bpm.  Rare PACs.  Interval History:   Kristina Richard presents here today accompanied by her 2 sisters.  She tells me that she has been having the sensation of chest tightness worse with exertion but she feels has been going on for maybe about a year now but is getting progressively worse.  She started to notice it with more vigorous activity about a year ago but is now noticing it for instance pulling garbage strands up on the road.  What she has been able to do was pause rest catch her breath and then keep going but now it is happening with more intensity and more frequency and recurring with less activity.  She feels it in her chest going up into her jaw.  She is also noted that this past week on Wednesday Thursday and Friday she just felt profoundly tired and fatigued and having these episodes of feeling really fast pounding heartbeats when she is trying to sleep at night.  These episodes are not associated with lightheadedness or dizziness or syncope or near syncope.  Is not that her heart is going very fast is just that is beating faster than usual and somewhat irregular.  She denies any resting chest discomfort and has not had any PND, orthopnea or edema.  She is felt somewhat  lightheaded but no syncope or near syncope.  No TIA or amaurosis fugax.  No claudication.   REVIEWED OF SYSTEMS   Review of Systems  Constitutional:  Positive for malaise/fatigue (Has noticed that she is much easier fatigued of late but she had been in the past.). Negative for weight loss (Has put on weight lately.).  HENT:  Negative for congestion.   Respiratory:  Positive for shortness of breath. Negative for cough and sputum production.   Cardiovascular:        Per HPI  Gastrointestinal:  Negative for blood in stool and melena.  Genitourinary:  Negative for hematuria.  Musculoskeletal:  Negative for falls, joint pain and myalgias.  Neurological:  Negative for dizziness and focal weakness.  Endo/Heme/Allergies:  Bruises/bleeds easily.  Psychiatric/Behavioral:  Negative for depression and memory loss. The patient is not nervous/anxious and does not have insomnia.   All other systems reviewed and are negative.   I have reviewed and (if needed) personally updated the patient's problem list, medications, allergies, past medical and surgical history,  social and family history.   PAST MEDICAL HISTORY   Past Medical History:  Diagnosis Date   Anemia    Arthritis    Back pain    Cataract    Diverticulosis    Hemorrhoids    History of bronchitis 4 yrs ago   History of colon polyps    Hypertension    Insomnia    takes Xanax nightly as needed for insomnia or anxiety   Joint pain    Joint swelling    Nocturia    Pneumonia    hx of-most recent 5 yrs ago   Primary localized osteoarthritis of left knee    Rash    upper body and states that it nefver goes away.Saw a dermatologist   Weakness    numbness and tingling in both     PAST SURGICAL HISTORY   Past Surgical History:  Procedure Laterality Date   24-Hour Holter  06/2022   Predominant NSR frequent short bursts of PAT (152 episodes) longest was 19 beats at a max rate of 140 bpm, fastest was 3 beats max rate 193 bpm.  Rare  PACs.   APPENDECTOMY     COLONOSCOPY     EYE SURGERY Bilateral    cataract   Myoview  05/07/2022   [facility]): 1. Moderate size, mild to moderate severity reversible apical perfusion defect consistent with impaired perfusion in the mid to distal LAD, 2. small to moderate size, mild severity reversible basal to mid inferoseptal defect suggestive of distal RCA ischemia.  3. Normal LV size and function.  EF 61%.   TONSILLECTOMY     TOTAL KNEE ARTHROPLASTY Left 06/24/2015   Procedure: TOTAL KNEE ARTHROPLASTY;  Surgeon: Elsie Saas, MD;  Location: [place];  Service: Orthopedics;  Laterality: Left;    Immunization History  Administered Date(s) Administered   Td 09/26/2015    MEDICATIONS/ALLERGIES   Current Meds  Medication Sig   atorvastatin (LIPITOR) 20 MG tablet Take 20 mg by mouth daily.   lisinopril (ZESTRIL) 20 MG tablet TAKE 1 TABLET(20 MG) BY MOUTH DAILY   LORazepam (ATIVAN) 2 MG tablet TAKE 1/2 TO 1 TABLET BY MOUTH AT BEDTIME AS NEEDED FOR INSOMINIA    Allergies  Allergen Reactions   Hydrocodone Other (See Comments)    Reports that if she gets to much she had trouble breathing and blacked out   Morphine And Related     Doesn't want any morphine and sick to stomach   Codeine Other (See Comments)    Can do codones like hydrocodone or oxycodone    SOCIAL HISTORY/FAMILY HISTORY   Reviewed in Epic:   Social History   Tobacco Use   Smoking status: Never   Smokeless tobacco: Never  Vaping Use   Vaping Use: Never used  Substance Use Topics   Alcohol use: No    Comment: Drinks maybe once or twice a year.   Drug use: No   Social History   Social History Narrative   Not on file  Rare alcohol.  Non-smoker.  Family History  Problem Relation Age of Onset   Cancer Mother    Hyperlipidemia Father    Hypertension Father    Heart disease Brother    Diabetes Mellitus II Brother    Hypertension Maternal Grandmother     OBJCTIVE -PE, EKG, labs   Wt  Readings from Last 3 Encounters:  07/29/22 154 lb 9.6 oz (70.1 kg)  12/25/19 155 lb (70.3 kg)  10/25/19 158 lb (71.7  kg)    Physical Exam: BP 136/74   Pulse 88   Ht '5\' 2"'$  (1.575 m)   Wt 154 lb 9.6 oz (70.1 kg)   SpO2 100%   BMI 28.28 kg/m  Physical Exam Vitals reviewed.  Constitutional:      General: She is not in acute distress.    Appearance: Normal appearance. She is normal weight. She is not ill-appearing or toxic-appearing.  HENT:     Head: Normocephalic and atraumatic.  Eyes:     Extraocular Movements: Extraocular movements intact.     Pupils: Pupils are equal, round, and reactive to light.  Neck:     Vascular: No carotid bruit or JVD.  Cardiovascular:     Rate and Rhythm: Normal rate and regular rhythm. Occasional Extrasystoles are present.    Chest Wall: PMI is not displaced.     Pulses: Normal pulses.     Heart sounds: Normal heart sounds, S1 normal and S2 normal. No murmur heard.    No friction rub. No gallop.  Pulmonary:     Effort: Pulmonary effort is normal. No respiratory distress.     Breath sounds: Normal breath sounds. No wheezing, rhonchi or rales.  Chest:     Chest wall: No tenderness.  Abdominal:     General: Abdomen is flat. Bowel sounds are normal. There is no distension.     Palpations: Abdomen is soft. There is no mass (No HSM or bruit).     Tenderness: There is no abdominal tenderness. There is no guarding or rebound.     Hernia: No hernia is present.  Musculoskeletal:     Cervical back: Normal range of motion and neck supple.  Neurological:     Mental Status: She is alert.      Adult ECG Report  Rate: 88 ;  Rhythm: normal sinus rhythm and low voltage.  Cannot exclude septal MI, age-indeterminate. ;   Narrative Interpretation: Reviewed  Recent Labs:  06/29/2022-reviewed from Dr. Edson Snowball Na+ 142, K+ 4.0, Cl- 101, HCO3-33.5, BUN 11, Cr 1.0, Glu 86, Ca2+ 9.7; AST 19, ALT 16, AlkP 75 CBC: W 7.7, H/H 13.9/43.8, Plt 400 TC 230, TG 104, HDL 54,  LDL 155; A1c 5.9%; TSH 2.14 LDL P 1580 (high), small LDL P 378, HDL P 33.6 (both normal)  ================================================== I spent a total of 43 minutes with the patient spent in direct patient consultation.  Additional time spent with chart review  / charting (studies, outside notes, etc): 35 min (17 min pre-charting, 18 min charting) Total Time: 78 min  Current medicines are reviewed at length with the patient today.  (+/- concerns) n/a  Notice: This dictation was prepared with Dragon dictation along with smart phrase technology. Any transcriptional errors that result from this process are unintentional and may not be corrected upon review.   Studies Ordered:  Orders Placed This Encounter  Procedures   Basic metabolic panel   CBC   EKG 12-Lead   Meds ordered this encounter  Medications   metoprolol succinate (TOPROL XL) 25 MG 24 hr tablet    Sig: Take 1 tablet (25 mg total) by mouth daily.    Dispense:  90 tablet    Refill:  2   aspirin EC 81 MG tablet    Sig: Take 1 tablet (81 mg total) by mouth daily. Swallow whole.    Dispense:  90 tablet    Refill:  3    Patient Instructions / Medication Changes & Studies & Tests Ordered  Shared Decision Making/Informed Consent The risks [stroke (1 in 1000), death (1 in 1000), kidney failure [usually temporary] (1 in 500), bleeding (1 in 200), allergic reaction [possibly serious] (1 in 200)], benefits (diagnostic support and management of coronary artery disease) and alternatives of a cardiac catheterization were discussed in detail with Kristina Richard and she is willing to proceed.   Patient Instructions  Medication Instructions:  Start taking Aspirin 81 mg daily  ( take 4 tablets today )  Metoprolol succinate 25 mg one tablet daily  *If you need a refill on your cardiac medications before your next appointment, please call your pharmacy*   Lab Work: today  Bmp cbc If you have labs (blood work) drawn today and  your tests are completely normal, you will receive your results only by: [place] (if you have MyChart) OR A paper copy in the mail If you have any lab test that is abnormal or we need to change your treatment, we will call you to review the results.   Testing/Procedures: will be schedule at [place] physician has requested that you have a cardiac catheterization. Cardiac catheterization is used to diagnose and/or treat various heart conditions. Doctors may recommend this procedure for a number of different reasons. The most common reason is to evaluate chest pain. Chest pain can be a symptom of coronary artery disease (CAD), and cardiac catheterization can show whether plaque is narrowing or blocking your heart's arteries. This procedure is also used to evaluate the valves, as well as measure the blood flow and oxygen levels in different parts of your heart. For further information please visit HugeFiesta.tn. Please follow instruction sheet, as given.    Follow-Up: At [facility], you and your health needs are our priority.  As part of our continuing mission to provide you with exceptional heart care, we have created designated Provider Care Teams.  These Care Teams include your primary Cardiologist (physician) and Advanced Practice Providers (APPs -  Physician Assistants and Nurse Practitioners) who all work together to provide you with the care you need, when you need it.     Your next appointment:   1  to 2 week(s) after  Aug 07 2022  The format for your next appointment:   In Person  Provider:   Dr Tonia Ghent    Other Instructions    [place] [facility] A DEPT OF [place]. [facility] AVE A DEPT OF [place]. [facility] [place] [id number] [place] [address] Dept: [phone] Loc: [place]  07/29/2022  You are scheduled  for a Cardiac Catheterization on Friday, January 26 with Dr. Glenetta Hew.  1. Please arrive at the [facility] (Main Entrance A) at [facility] [address] [place], [place] [address] at 5:30 AM (This time is two hours before your procedure to ensure your preparation). Free valet parking service is available.   Special note: Every effort is made to have your procedure done on time. Please understand that emergencies sometimes delay scheduled procedures.  2. Diet: Do not eat solid foods after midnight.  The patient may have clear liquids until 5am upon the day of the procedure.  3. Labs: CBC, BMP  -today   4. Medication instructions in preparation for your procedure:   Contrast Allergy: No   Do not  take, Lisinopril (Zestril or Prinivil) Friday, January 26,  On the morning of your procedure, take your Aspirin 81 mg and any morning medicines NOT listed above.  You may use sips of water.  5. Plan for one night stay--bring personal belongings. 6. Bring a current list of your medications and current insurance cards. 7. You MUST have a responsible person to drive you home. 8. Someone MUST be with you the first 24 hours after you arrive home or your discharge will be delayed. 9. Please wear clothes that are easy to get on and off and wear slip-on shoes.  Thank you for allowing [place] to care for you!   -- [place] [facility]     Leonie Man, MD, MS Glenetta Hew, M.D., M.S. Interventional Cardiologist  [place]  Pager # [phone] Phone # [phone] [address]. [place], [place] [address]   Thank you for choosing [facility]!!

## 2022-07-29 ENCOUNTER — Ambulatory Visit: Payer: Medicare Other | Attending: Cardiology | Admitting: Cardiology

## 2022-07-29 ENCOUNTER — Encounter: Payer: Self-pay | Admitting: Cardiology

## 2022-07-29 VITALS — BP 136/74 | HR 88 | Ht 62.0 in | Wt 154.6 lb

## 2022-07-29 DIAGNOSIS — I10 Essential (primary) hypertension: Secondary | ICD-10-CM

## 2022-07-29 DIAGNOSIS — I209 Angina pectoris, unspecified: Secondary | ICD-10-CM | POA: Insufficient documentation

## 2022-07-29 DIAGNOSIS — R9439 Abnormal result of other cardiovascular function study: Secondary | ICD-10-CM | POA: Diagnosis not present

## 2022-07-29 DIAGNOSIS — R000 Tachycardia, unspecified: Secondary | ICD-10-CM | POA: Diagnosis not present

## 2022-07-29 DIAGNOSIS — R7303 Prediabetes: Secondary | ICD-10-CM

## 2022-07-29 MED ORDER — METOPROLOL SUCCINATE ER 25 MG PO TB24: 25.0000 mg | ORAL_TABLET | Freq: Every day | ORAL | 2 refills | Status: DC

## 2022-07-29 MED ORDER — ASPIRIN 81 MG PO TBEC: 81.0000 mg | DELAYED_RELEASE_TABLET | Freq: Every day | ORAL | 3 refills | Status: AC

## 2022-07-29 NOTE — Assessment & Plan Note (Signed)
BP is stable today.  She says is usually lower than this at home.  She is on lisinopril that was recently started and we are starting Toprol today.

## 2022-07-29 NOTE — Assessment & Plan Note (Signed)
Myoview suggest two-vessel CAD which could either be a large wraparound LAD or LAD and RCA or dominant circumflex. Read as high risk it was indication for cardiac catheterization.  I discussed the results of the stress test and the risks benefits alternatives and indications of cardiac catheterization.  Clear indication high risk stress test to proceed with cath.  After discussion, she agrees.  Informed consent noted below.  Please symptoms worsen, or occur at rest, she would let us know and we can push up the date for cardiac catheterization as an urgent procedure.  Scheduled for 1st Case 1/26 AM - > Load ASA 324 mg tonite & 81 mg daily Start Toprol 25 mg daily Continue statin

## 2022-07-29 NOTE — Patient Instructions (Addendum)
Medication Instructions:  Start taking Aspirin 81 mg daily  ( take 4 tablets today )  Metoprolol succinate 25 mg one tablet daily  *If you need a refill on your cardiac medications before your next appointment, please call your pharmacy*   Lab Work: today  Bmp cbc If you have labs (blood work) drawn today and your tests are completely normal, you will receive your results only by: [place] (if you have MyChart) OR A paper copy in the mail If you have any lab test that is abnormal or we need to change your treatment, we will call you to review the results.   Testing/Procedures: will be schedule at [place] physician has requested that you have a cardiac catheterization. Cardiac catheterization is used to diagnose and/or treat various heart conditions. Doctors may recommend this procedure for a number of different reasons. The most common reason is to evaluate chest pain. Chest pain can be a symptom of coronary artery disease (CAD), and cardiac catheterization can show whether plaque is narrowing or blocking your heart's arteries. This procedure is also used to evaluate the valves, as well as measure the blood flow and oxygen levels in different parts of your heart. For further information please visit HugeFiesta.tn. Please follow instruction sheet, as given.    Follow-Up: At [facility], you and your health needs are our priority.  As part of our continuing mission to provide you with exceptional heart care, we have created designated Provider Care Teams.  These Care Teams include your primary Cardiologist (physician) and Advanced Practice Providers (APPs -  Physician Assistants and Nurse Practitioners) who all work together to provide you with the care you need, when you need it.     Your next appointment:   1  to 2 week(s) after  Aug 07 2022  The format for your next appointment:   In Person  Provider:   Dr Tonia Ghent     Other Instructions    [place] [facility] A DEPT OF [place]. [facility] AVE A DEPT OF [place]. [facility] [place] [id number] [place] [address] Dept: [phone] Loc: [place]  07/29/2022  You are scheduled for a Cardiac Catheterization on Friday, January 26 with Dr. Glenetta Hew.  1. Please arrive at the [facility] (Main Entrance A) at [facility]: [address] [place], [place] [address] at 5:30 AM (This time is two hours before your procedure to ensure your preparation). Free valet parking service is available.   Special note: Every effort is made to have your procedure done on time. Please understand that emergencies sometimes delay scheduled procedures.  2. Diet: Do not eat solid foods after midnight.  The patient may have clear liquids until 5am upon the day of the procedure.  3. Labs: CBC, BMP  -today   4. Medication instructions in preparation for your procedure:   Contrast Allergy: No   Do not  take, Lisinopril (Zestril or Prinivil) Friday, January 26,    On the morning of your procedure, take your Aspirin 81 mg and any morning medicines NOT listed above.  You may use sips of water.  5. Plan for one night stay--bring personal belongings. 6. Bring a current list of your medications and current insurance cards. 7. You MUST have a responsible person to drive you home. 8. Someone MUST be with you the first 24  hours after you arrive home or your discharge will be delayed. 9. Please wear clothes that are easy to get on and off and wear slip-on shoes.  Thank you for allowing [place] to care for you!   -- [place] [facility] services

## 2022-07-29 NOTE — Assessment & Plan Note (Signed)
Monitor showed PACs and runs of PAT, symptoms are describing.  Cleared her consistent with palpitations and she may have been feeling PVCs and/or PACs.  Start low-dose beta-blocker especially given concern for CAD.  Toprol 25 mg daily started today.

## 2022-07-29 NOTE — Assessment & Plan Note (Signed)
Somewhat concerning as it was a progressively worsening exertional chest discomfort.  At least class III Angina and now confirmed with positive nuclear stress test.  Indication based on stress test results will be to proceed with cardiac catheterization and possible PCI.  I explained to her the other options which would be Coronary CTA but that would still require [place] going for catheterization if that confirms the stress test findings.  Procedure discussed in great detail with the patient and her her sisters.  She agrees to proceed.  See informed consent below.  Plan: Schedule cardiac catheterization for first case on August 07, 2022 Labs drawn today Load aspirin 81 mg x 4 today and then 80 mg daily starting tomorrow. Especially with her having rapid palpitations and likely CAD/angina will add Toprol 25 mg daily. Continue statin

## 2022-07-30 ENCOUNTER — Other Ambulatory Visit: Payer: Self-pay | Admitting: *Deleted

## 2022-07-30 DIAGNOSIS — R9439 Abnormal result of other cardiovascular function study: Secondary | ICD-10-CM

## 2022-07-30 DIAGNOSIS — I209 Angina pectoris, unspecified: Secondary | ICD-10-CM

## 2022-07-30 LAB — CBC
Hematocrit: 41.2 % (ref 34.0–46.6)
Hemoglobin: 12.8 g/dL (ref 11.1–15.9)
MCH: 27 pg (ref 26.6–33.0)
MCHC: 31.1 g/dL — ABNORMAL LOW (ref 31.5–35.7)
MCV: 87 fL (ref 79–97)
Platelets: 329 10*3/uL (ref 150–450)
RBC: 4.74 x10E6/uL (ref 3.77–5.28)
RDW: 12.1 % (ref 11.7–15.4)
WBC: 10.8 10*3/uL (ref 3.4–10.8)

## 2022-07-30 LAB — BASIC METABOLIC PANEL
BUN/Creatinine Ratio: 13 (ref 12–28)
BUN: 9 mg/dL (ref 8–27)
CO2: 27 mmol/L (ref 20–29)
Calcium: 9.1 mg/dL (ref 8.7–10.3)
Chloride: 100 mmol/L (ref 96–106)
Creatinine, Ser: 0.71 mg/dL (ref 0.57–1.00)
Glucose: 90 mg/dL (ref 70–99)
Potassium: 4.7 mmol/L (ref 3.5–5.2)
Sodium: 139 mmol/L (ref 134–144)
eGFR: 86 mL/min/{1.73_m2} (ref >59)

## 2022-07-30 NOTE — Progress Notes (Signed)
Order placed for left heart cath /angiography with possible PC!

## 2022-08-06 ENCOUNTER — Telehealth: Payer: Self-pay | Admitting: *Deleted

## 2022-08-06 NOTE — Telephone Encounter (Signed)
Reviewed procedure instructions with patient.  

## 2022-08-06 NOTE — Telephone Encounter (Signed)
Patient was returning call. Please advise ?

## 2022-08-06 NOTE — Telephone Encounter (Signed)
Cardiac Catheterization scheduled at [facility] for: Friday August 07, 2022 7:30 AM Arrival time and place: [place] Entrance A at: 5:30 AM  Nothing to eat after midnight prior to procedure, clear liquids until 5 AM day of procedure.  Medication instructions: -Usual morning medications can be taken with sips of water including aspirin 81 mg.  Confirmed patient has responsible adult to drive home post procedure and be with patient first 24 hours after arriving home.  Patient reports no new symptoms concerning for COVID-19 in the past 10 days.  Left message for patient to call back to review procedure instructions

## 2022-08-07 ENCOUNTER — Other Ambulatory Visit: Payer: Self-pay

## 2022-08-07 ENCOUNTER — Encounter (HOSPITAL_COMMUNITY)
Admission: RE | Disposition: A | Discharge status: Home / Self Care | Payer: Self-pay | Source: Home / Self Care | Attending: Cardiology

## 2022-08-07 ENCOUNTER — Ambulatory Visit (HOSPITAL_COMMUNITY)
Admission: RE | Admit: 2022-08-07 | Discharge: 2022-08-07 | Disposition: A | Discharge status: Home / Self Care | Payer: Medicare Other | Attending: Cardiology | Admitting: Cardiology

## 2022-08-07 DIAGNOSIS — I4719 Other supraventricular tachycardia: Secondary | ICD-10-CM | POA: Insufficient documentation

## 2022-08-07 DIAGNOSIS — Z7982 Long term (current) use of aspirin: Secondary | ICD-10-CM | POA: Diagnosis not present

## 2022-08-07 DIAGNOSIS — I209 Angina pectoris, unspecified: Secondary | ICD-10-CM

## 2022-08-07 DIAGNOSIS — E785 Hyperlipidemia, unspecified: Secondary | ICD-10-CM | POA: Insufficient documentation

## 2022-08-07 DIAGNOSIS — R9439 Abnormal result of other cardiovascular function study: Secondary | ICD-10-CM

## 2022-08-07 DIAGNOSIS — R7303 Prediabetes: Secondary | ICD-10-CM | POA: Insufficient documentation

## 2022-08-07 DIAGNOSIS — I10 Essential (primary) hypertension: Secondary | ICD-10-CM | POA: Insufficient documentation

## 2022-08-07 DIAGNOSIS — I2584 Coronary atherosclerosis due to calcified coronary lesion: Secondary | ICD-10-CM | POA: Insufficient documentation

## 2022-08-07 DIAGNOSIS — I25119 Atherosclerotic heart disease of native coronary artery with unspecified angina pectoris: Secondary | ICD-10-CM | POA: Diagnosis not present

## 2022-08-07 DIAGNOSIS — Z79899 Other long term (current) drug therapy: Secondary | ICD-10-CM | POA: Diagnosis not present

## 2022-08-07 HISTORY — PX: LEFT HEART CATH AND CORONARY ANGIOGRAPHY: CATH118249

## 2022-08-07 SURGERY — LEFT HEART CATH AND CORONARY ANGIOGRAPHY
Anesthesia: LOCAL

## 2022-08-07 MED ORDER — SODIUM CHLORIDE 0.9% FLUSH: 3.0000 mL | INTRAVENOUS | Status: DC | PRN

## 2022-08-07 MED ORDER — LIDOCAINE HCL (PF) 1 % IJ SOLN
INTRAMUSCULAR | Status: DC | PRN
  Administered 2022-08-07: 2 mL via INTRADERMAL

## 2022-08-07 MED ORDER — FENTANYL CITRATE (PF) 100 MCG/2ML IJ SOLN
INTRAMUSCULAR | Status: DC | PRN
  Administered 2022-08-07: 25 ug via INTRAVENOUS

## 2022-08-07 MED ORDER — SODIUM CHLORIDE 0.9% FLUSH: 3.0000 mL | Freq: Two times a day (BID) | INTRAVENOUS | Status: DC

## 2022-08-07 MED ORDER — SODIUM CHLORIDE 0.9 % IV SOLN: INTRAVENOUS | Status: DC

## 2022-08-07 MED ORDER — FENTANYL CITRATE (PF) 100 MCG/2ML IJ SOLN
INTRAMUSCULAR | Status: AC
  Filled 2022-08-07: qty 2

## 2022-08-07 MED ORDER — VERAPAMIL HCL 2.5 MG/ML IV SOLN
INTRAVENOUS | Status: DC | PRN
  Administered 2022-08-07: 10 mL via INTRA_ARTERIAL

## 2022-08-07 MED ORDER — ONDANSETRON HCL 4 MG/2ML IJ SOLN: 4.0000 mg | Freq: Four times a day (QID) | INTRAMUSCULAR | Status: DC | PRN

## 2022-08-07 MED ORDER — LABETALOL HCL 5 MG/ML IV SOLN: 10.0000 mg | INTRAVENOUS | Status: DC | PRN

## 2022-08-07 MED ORDER — VERAPAMIL HCL 2.5 MG/ML IV SOLN
INTRAVENOUS | Status: AC
  Filled 2022-08-07: qty 2

## 2022-08-07 MED ORDER — HEPARIN SODIUM (PORCINE) 1000 UNIT/ML IJ SOLN
INTRAMUSCULAR | Status: DC | PRN
  Administered 2022-08-07: 3500 [IU] via INTRAVENOUS

## 2022-08-07 MED ORDER — MIDAZOLAM HCL 2 MG/2ML IJ SOLN
INTRAMUSCULAR | Status: DC | PRN
  Administered 2022-08-07: 1 mg via INTRAVENOUS

## 2022-08-07 MED ORDER — HEPARIN (PORCINE) IN NACL 1000-0.9 UT/500ML-% IV SOLN
INTRAVENOUS | Status: AC
  Filled 2022-08-07: qty 1000

## 2022-08-07 MED ORDER — HEPARIN (PORCINE) IN NACL 1000-0.9 UT/500ML-% IV SOLN
INTRAVENOUS | Status: DC | PRN
  Administered 2022-08-07 (×2): 500 mL

## 2022-08-07 MED ORDER — SODIUM CHLORIDE 0.9 % IV SOLN: 250.0000 mL | INTRAVENOUS | Status: DC | PRN

## 2022-08-07 MED ORDER — HYDRALAZINE HCL 20 MG/ML IJ SOLN: 10.0000 mg | INTRAMUSCULAR | Status: DC | PRN

## 2022-08-07 MED ORDER — HEPARIN SODIUM (PORCINE) 1000 UNIT/ML IJ SOLN
INTRAMUSCULAR | Status: AC
  Filled 2022-08-07: qty 10

## 2022-08-07 MED ORDER — SODIUM CHLORIDE 0.9 % WEIGHT BASED INFUSION: 1.0000 mL/kg/h | INTRAVENOUS | Status: DC

## 2022-08-07 MED ORDER — ACETAMINOPHEN 325 MG PO TABS: 650.0000 mg | ORAL_TABLET | ORAL | Status: DC | PRN

## 2022-08-07 MED ORDER — LIDOCAINE HCL (PF) 1 % IJ SOLN
INTRAMUSCULAR | Status: AC
  Filled 2022-08-07: qty 30

## 2022-08-07 MED ORDER — MIDAZOLAM HCL 2 MG/2ML IJ SOLN
INTRAMUSCULAR | Status: AC
  Filled 2022-08-07: qty 2

## 2022-08-07 MED ORDER — SODIUM CHLORIDE 0.9 % WEIGHT BASED INFUSION
3.0000 mL/kg/h | INTRAVENOUS | Status: AC
  Administered 2022-08-07: 3 mL/kg/h via INTRAVENOUS

## 2022-08-07 SURGICAL SUPPLY — 12 items
CATH 5FR JL3.5 JR4 ANG PIG MP (CATHETERS) IMPLANT
DEVICE RAD COMP TR BAND LRG (VASCULAR PRODUCTS) IMPLANT
GLIDESHEATH SLEND SS 6F .021 (SHEATH) IMPLANT
GUIDEWIRE INQWIRE 1.5J.035X260 (WIRE) IMPLANT
INQWIRE 1.5J .035X260CM (WIRE) ×1
KIT HEART LEFT (KITS) ×1 IMPLANT
PACK CARDIAC CATHETERIZATION (CUSTOM PROCEDURE TRAY) ×1 IMPLANT
PROTECTION STATION PRESSURIZED (MISCELLANEOUS) ×1
SHEATH PROBE COVER 6X72 (BAG) IMPLANT
STATION PROTECTION PRESSURIZED (MISCELLANEOUS) IMPLANT
TRANSDUCER W/STOPCOCK (MISCELLANEOUS) ×1 IMPLANT
TUBING CIL FLEX 10 FLL-RA (TUBING) ×1 IMPLANT

## 2022-08-07 NOTE — Interval H&P Note (Signed)
History and Physical Interval Note:  08/07/2022 7:43 AM  [name]  has presented today for surgery, with the diagnosis of abnormal stress test.  The various methods of treatment have been discussed with the patient and family. After consideration of risks, benefits and other options for treatment, the patient has consented to  Procedure(s): LEFT HEART CATH AND CORONARY ANGIOGRAPHY (N/A)  PERCUTANEOUS CORONARY INTERVENTION   as a surgical intervention.  The patient's history has been reviewed, patient examined, no change in status, stable for surgery.  I have reviewed the patient's chart and labs.  Questions were answered to the patient's satisfaction.    Cath Lab Visit (complete for each Cath Lab visit)  Clinical Evaluation Leading to the Procedure:   ACS: No.  Non-ACS:    Anginal Classification: CCS III  Anti-ischemic medical therapy: Minimal Therapy (1 class of medications)  Non-Invasive Test Results: High-risk stress test findings: cardiac mortality >3%/year  Prior CABG: No previous CABG   Glenetta Hew

## 2022-08-07 NOTE — Brief Op Note (Signed)
BRIEF CARDIAC CATHETERIZATION NOTE  NAME: Kristina Richard  MRN: [id number] 08/07/2022 8:22 AM  Primary Care Provider: Candida Peeling, PA-C Primary Cardiologist: Dr. Tonia Ghent [facility] Interventional Cardiologist: Glenetta Hew, MD   Referring Cardiologist: Dr. Tonia Ghent   SURGEON:  Surgeon(s) and Role: Leonie Man, MD - Primary   PROCEDURE:  Procedure(s): LEFT HEART CATH AND CORONARY ANGIOGRAPHY (N/A)  PATIENT:  Kristina Richard is a 82 y.o. female with PMH notable for HTN, HLD, Prediabetes and Paroxysmal Atrial Tachycardia (PAT) who was recently seen in consultation for the evaluation of Abnormal Nuclear Stress Test & Class III Angina at the request of  Dr. Tonia Ghent [facility]).  Stress test suggested moderate sized reversible perfusion defect in the mid to distal LAD as well as distal RCA territories.  Normal EF.  She was having symptoms of chest tightness and dyspnea with exertion.  These were concerning for class III angina symptoms.  With positive stress test and symptoms she was referred for cardiac catheterization.  Shared Decision Making/Informed Consent The risks [stroke (1 in 1000), death (1 in 1000), kidney failure [usually temporary] (1 in 500), bleeding (1 in 200), allergic reaction [possibly serious] (1 in 200)], benefits (diagnostic support and management of coronary artery disease) and alternatives of a cardiac catheterization were discussed in detail with Ms. Clevinger and she is willing to proceed.  PRE-OPERATIVE DIAGNOSIS:  abnormal stress test  POST-OPERATIVE DIAGNOSIS: FALSE POSITIVE STRESS TEST Angiographically Minimal Coronary Disease: Mild diffuse disease in the RCA with no significant lesion.  30% mid lesion.  Small PDA major PL branch. Diffuse proximal LAD calcification with no significant stenosis.  At most 20% mid LAD with several small diagonal branches. LCx gives rise to a high OM  branch that courses as a Ramus Intermedius followed by 1 tiny OM and 2 smaller lateral OM branches with mild diffuse disease. EF 55 to 60% normal wall motion.  Normal EDP.   PROCEDURE PERFORMED Time Out: Verified patient identification, verified procedure, site/side was marked, verified correct patient position, special equipment/implants available, medications/allergies/relevent history reviewed, required imaging and test results available. Performed.  Access:  RIGHT radial Artery: 6 Fr sheath -- Seldinger technique using Micropuncture Kit -- Direct ultrasound guidance used.  Permanent image obtained and placed on chart. -- 10 mL radial cocktail IA; 3500 units IV Heparin  Left Heart Catheterization: 5 Fr Catheters advanced or exchanged over a J-wire under direct fluoroscopic guidance into the ascending aorta; JR4 catheter advanced first.  * LV Hemodynamics (LV Gram) & Right Coronary Artery Cineangiography: JR4 catheter * Left Coronary Artery Cineangiography: JL 3.5 catheter   Review of initial angiography revealed: Minimal CAD with no obvious culprit lesion for anterior or inferior ischemia  Upon completion of Angiogaphy, the catheter was removed completely out of the body over a wire, without complication.  Radial sheath removed in the Cardiac Catheterization lab with TR Band placed for hemostasis.  TR Band: 0810  Hours; 15 mL air; Reverse Barbeau Level C  MEDICATIONS SQ Lidocaine 3 mL Radial Cocktail: 3 mg Verapmil in 10 mL NS Heparin: 3500 units   ANESTHESIA:   local and IV sedation  EBL:  < 50 mL   BLOOD ADMINISTERED:none  COUNTS:  YES  DICTATION: .Note written in EPIC  PATIENT DISPOSITION:  PACU - hemodynamically stable.  PLAN OF CARE: Discharge to home after PACU She will be discharged after bedrest, continue medical management and fo getting more palpitations  Leonie Man, MD, MS Glenetta Hew, M.D., M.S. Interventional Cardiologist  [facility]  Pager # [phone] Phone # [phone] [address] [place], [place] [address]

## 2022-08-07 NOTE — Progress Notes (Signed)
TR BAND REMOVAL  LOCATION:    right radial  DEFLATED PER PROTOCOL:    Yes.    TIME BAND OFF / DRESSING APPLIED: 08/07/22 at [place] ARRIVAL:    Level 0  SITE AFTER BAND REMOVAL:    Level 0  CIRCULATION SENSATION AND MOVEMENT:    Within Normal Limits   Yes.    COMMENTS:

## 2022-08-10 ENCOUNTER — Encounter (HOSPITAL_COMMUNITY): Payer: Self-pay | Admitting: Cardiology

## 2022-09-04 ENCOUNTER — Ambulatory Visit
Admission: RE | Admit: 2022-09-04 | Discharge: 2022-09-04 | Disposition: A | Discharge status: Home / Self Care | Payer: Medicare Other | Source: Ambulatory Visit | Attending: Pain Medicine | Admitting: Pain Medicine

## 2022-09-04 DIAGNOSIS — Z1231 Encounter for screening mammogram for malignant neoplasm of breast: Secondary | ICD-10-CM

## 2022-11-10 ENCOUNTER — Other Ambulatory Visit: Payer: Self-pay

## 2022-11-10 ENCOUNTER — Encounter: Payer: Self-pay | Admitting: Neurology

## 2022-11-10 DIAGNOSIS — R202 Paresthesia of skin: Secondary | ICD-10-CM

## 2022-12-14 ENCOUNTER — Encounter: Payer: Medicare Other | Admitting: Neurology

## 2023-03-29 ENCOUNTER — Encounter: Payer: Medicare Other | Admitting: Neurology

## 2023-04-23 ENCOUNTER — Other Ambulatory Visit: Payer: Self-pay | Admitting: Cardiology

## 2023-07-19 ENCOUNTER — Ambulatory Visit: Payer: Medicare Other | Admitting: Neurology

## 2023-07-19 DIAGNOSIS — G5603 Carpal tunnel syndrome, bilateral upper limbs: Secondary | ICD-10-CM

## 2023-07-19 DIAGNOSIS — R202 Paresthesia of skin: Secondary | ICD-10-CM | POA: Diagnosis not present

## 2023-07-19 DIAGNOSIS — M5412 Radiculopathy, cervical region: Secondary | ICD-10-CM

## 2023-07-19 NOTE — Procedures (Signed)
 [facility]  [address] [place], [address]  [place], [place] [address] Tel: [phone] Fax: [phone] Test Date:  07/19/2023  Patient: Kristina Richard DOB: 1940/12/08 Physician: Venetia Potters, MD  Sex: Female Height: 5' 2 Ref Phys: Con Hamel, DO  ID#: [id number]   Technician:    History: This is an 83 year old female with neck pain, numbness, and tingling in hands.  NCV & EMG Findings: Extensive electrodiagnostic evaluation of bilateral upper limbs shows: Bilateral median sensory responses are absent. Bilateral ulnar and radial sensory responses are within normal limits. Bilateral median (APB) motor responses show prolonged distal onset latency (L5.4, R7.3 ms) and reduced amplitude (L4.6, R1.98 mV). Bilateral ulnar (ADM) motor responses are within normal limits. Chronic motor axon loss changes without accompanying active denervation changes are seen in bilateral abductor pollicis brevis, left first dorsal interosseous, left extensor indicis proprius, and left cervical paraspinal (C7 level) muscles.  Impression: This is an abnormal study. The findings are most consistent with the following: Evidence of bilateral median mononeuropathy at or distal to the wrist, consistent with carpal tunnel syndrome. The findings are severe in degree electrically on the right and moderate in degree electrically on the left. The residuals of an old intraspinal canal lesion (ie: motor radiculopathy) at the left C8 root or segment, mild in degree electrically. No definitive electrodiagnostic evidence of a right cervical (C5-T1) motor radiculopathy.    ___________________________ Venetia Potters, MD    Nerve Conduction Studies Motor Nerve Results    Latency Amplitude F-Lat Segment Distance CV Comment  Site (ms) Norm (mV) Norm (ms)  (cm) (m/s) Norm   Left Median (APB) Motor  Wrist *5.4  < 4.0 *4.6  > 5.0        Elbow 9.6 - 4.4 -  Elbow-Wrist 25 60  > 50   Right Median (APB) Motor   Wrist *7.3  < 4.0 *1.98  > 5.0        Elbow 12.2 - 1.83 -  Elbow-Wrist 25.5 52  > 50   Left Ulnar (ADM) Motor  Wrist 2.2  < 3.1 10.7  > 7.0        Bel elbow 5.3 - 9.4 -  Bel elbow-Wrist 19 61  > 50   Ab elbow 7.0 - 8.9 -  Ab elbow-Bel elbow 10 59 -   Right Ulnar (ADM) Motor  Wrist 2.0  < 3.1 9.8  > 7.0        Bel elbow 5.7 - 9.6 -  Bel elbow-Wrist 20 54  > 50   Ab elbow 7.7 - 8.0 -  Ab elbow-Bel elbow 10 50 -    Sensory Sites    Neg Peak Lat Amplitude (O-P) Segment Distance Velocity Comment  Site (ms) Norm (V) Norm  (cm) (ms)   Left Median Sensory  Wrist-Dig II *NR  < 3.8 *NR  > 10 Wrist-Dig II 13    Right Median Sensory  Wrist-Dig II *NR  < 3.8 *NR  > 10 Wrist-Dig II 13    Left Radial Sensory  Forearm-Wrist 1.90  < 2.8 18  > 10 Forearm-Wrist 10    Right Radial Sensory  Forearm-Wrist 2.3  < 2.8 15  > 10 Forearm-Wrist 10    Left Ulnar Sensory  Wrist-Dig V 2.9  < 3.2 15  > 5 Wrist-Dig V 11    Right Ulnar Sensory  Wrist-Dig V 3.0  < 3.2 8  > 5 Wrist-Dig V 11     Electromyography  Side Muscle Ins.Act Fibs Fasc Recrt Amp Dur Poly Activation Comment  Right FDI Nml Nml Nml Nml Nml Nml Nml Nml N/A  Right EIP Nml Nml Nml Nml Nml Nml Nml Nml N/A  Right APB Nml Nml Nml *3- *1+ *1+ *1+ Nml N/A  Right Pronator teres Nml Nml Nml Nml Nml Nml Nml Nml N/A  Right Biceps Nml Nml Nml Nml Nml Nml Nml Nml N/A  Right Triceps Nml Nml Nml Nml Nml Nml Nml Nml N/A  Right Deltoid Nml Nml Nml Nml Nml Nml Nml Nml N/A  Left FDI Nml Nml Nml *1- *1+ *1+ Nml Nml N/A  Left EIP Nml Nml Nml *1- *1+ *1+ Nml Nml N/A  Left APB Nml Nml Nml *2- *1+ *1+ Nml Nml N/A  Left Pronator teres Nml Nml Nml Nml Nml Nml Nml Nml N/A  Left Biceps Nml Nml Nml Nml Nml Nml Nml Nml N/A  Left Triceps Nml Nml Nml Nml Nml Nml Nml Nml N/A  Left Deltoid Nml Nml Nml Nml Nml Nml Nml Nml N/A  Left C7 PSP Nml Nml Nml *1- *1+ *1+ Nml Nml N/A      Waveforms:  Motor           Sensory

## 2023-07-28 ENCOUNTER — Other Ambulatory Visit: Payer: Self-pay | Admitting: Internal Medicine

## 2023-07-28 DIAGNOSIS — Z1231 Encounter for screening mammogram for malignant neoplasm of breast: Secondary | ICD-10-CM

## 2023-09-14 ENCOUNTER — Ambulatory Visit
Admission: RE | Admit: 2023-09-14 | Discharge: 2023-09-14 | Disposition: A | Discharge status: Home / Self Care | Payer: Medicare Other | Source: Ambulatory Visit | Attending: Internal Medicine

## 2023-09-14 DIAGNOSIS — Z1231 Encounter for screening mammogram for malignant neoplasm of breast: Secondary | ICD-10-CM

## 2023-09-22 ENCOUNTER — Ambulatory Visit
Admission: RE | Admit: 2023-09-22 | Discharge: 2023-09-22 | Disposition: A | Discharge status: Home / Self Care | Source: Ambulatory Visit | Attending: Internal Medicine

## 2023-10-18 ENCOUNTER — Other Ambulatory Visit: Payer: Self-pay | Admitting: Cardiology

## 2023-10-19 ENCOUNTER — Other Ambulatory Visit: Payer: Self-pay | Admitting: Cardiology

## 2024-02-10 NOTE — Progress Notes (Signed)
 Cardiology Office Note:  .   Date:  02/13/2024  ID:  Kristina Richard, DOB Feb 06, 1941, MRN [id number] PCP: Valentin Skates, DO  [facility] Providers Cardiologist:  Alm Clay, MD     No chief complaint on file.   Patient Profile: Kristina Richard is a 83 y.o. female  with a PMH notable for HTN, HLD, pre-DM and PAT who presents here for reassessment of palpitations at the request of Valentin Skates, DO.  She was seen on July 29, 2018 for at the request of Dr. Norleen Blare (cardiologist from [facility]) for symptoms concerning for class III angina, evaluated with a Myoview stress test that was positive.  We discussed coronary CTA versus cardiac catheterization => she agreed to proceed with catheterization on August 07, 2022 revealing minimal CAD.  She is also noted to have PACs and runs of PAT on her monitor.  Restarted on low-dose beta-blocker Toprol  25 mg daily.  Following her catheterization the plan was for her to follow-up with Dr. Blare.    Kristina Richard was seen by Dr. Valentin to establish PCP.  Apparently in late February 2025 she stopped metoprolol  and lisinopril  and was switched to bisoprolol.  During this visit she just completed wearing a 7-day monitor because symptoms were happening maybe once or twice a minute.  She was back to exercising and doing PT for her neck and back.  She noted that being back on bisoprolol seem to be helping her palpitations.  She has moved back to [place] - so is establishing care here Dr. Blare for follow-up.  Subjective  Discussed the use of AI scribe software for clinical note transcription with the patient, who gave verbal consent to proceed.  History of Present Illness  Kristina Richard is an 83 year old female with hypertension who presents with palpitations and sweating episodes. She is accompanied by her sister.  She experiences palpitations, more noticeable at night, described as 'flutter bites', with  decreased frequency during the day. These episodes are accompanied by fatigue, heavy sweating, and shortness of breath, occurring daily. Symptoms have shown some improvement with the recent change in medication to bisoprolol.  Previously, she was on metoprolol  and lisinopril  but was switched to bisoprolol-HCTZ 10-6.25 mg daily. She reports having had a cough, which may have been the reason for stopping lisinopril .. Her current medications include bisoprolol-HCTZ (10-6.25 mg daily), a statin (atorvastatin 20 mg), and aspiri 81 mg. Her home blood pressure readings are around 140s mmHg.  She describes episodes of feeling flushed and tired, sometimes accompanied by a sensation of her heart racing, particularly after exercising with chair yoga. She also reports a squeezing sensation in her chest that makes her feel like she is choking and lightheaded, occurring unpredictably.  Initially since being on the bisoprolol, the palpitations have definitely improved, but she is starting to feel the back up again.  What she feels is a sudden 1 or 2 pounding beats that come and go.  They are maybe 1 or 2 every so often are or a couple of them in a row..     Objective   Family History: Father deceased-hypertension; mother deceased hypertension and malignant melanoma; brother had questionable rheumatic heart disease and DM-2.  Studies Reviewed: SABRA   EKG Interpretation Date/Time:  Friday February 11 2024 10:07:30 EDT Ventricular Rate:  51 PR Interval:  178 QRS Duration:  88 QT Interval:  440 QTC Calculation: 405 R Axis:   -  30  Text Interpretation: Sinus bradycardia Left axis deviation Low voltage QRS Septal infarct , age undetermined When compared with ECG of 25-Dec-2019 17:46, PREVIOUS ECG IS PRESENT Confirmed by Anner Lenis ([id number]) on 02/11/2024 10:28:19 AM    Results Cardiac monitor: Activity noted, no atrial fibrillation, presence of premature beats (full report not available-this was based on her  recollection from Dr. Teresa office) CATH: ost-mid LAD 5%, mLAD 15% (calcified); m-dRCA 30%. LVEF ~55-65%. Normal LVEDP (08/07/2022)    Risk Assessment/Calculations:     HYPERTENSION CONTROL Vitals:   02/11/24 1000   BP: (!) 148/81 (!) 142/78    The patient's blood pressure is elevated above target today.  In order to address the patient's elevated BP: A new medication was prescribed today.; Blood pressure will be monitored at home to determine if medication changes need to be made. (Start irbesartan  75 mg daily and continue bisoprolol-HCTZ 10-12.5 mg daily)           Physical Exam:   VS:  BP (!) 142/78   Pulse (!) 51   Ht 5' 2 (1.575 m)   Wt 160 lb 12.8 oz (72.9 kg)   SpO2 97%   BMI 29.41 kg/m    Wt Readings from Last 3 Encounters:  02/11/24 160 lb 12.8 oz (72.9 kg)  08/07/22 154 lb (69.9 kg)  07/29/22 154 lb 9.6 oz (70.1 kg)    GEN: Well appearing.  Well nourished, well groomed in no acute distress; NECK: No JVD; No carotid bruits CARDIAC: RRR with occasional ectopy; Normal S1 & S2; no murmurs, rubs, gallops RESPIRATORY:  Clear to auscultation without rales, wheezing or rhonchi ; nonlabored, good air movement. ABDOMEN: Soft, non-tender, non-distended EXTREMITIES:  No edema; No deformity      ASSESSMENT AND PLAN: .    Problem List Items Addressed This Visit       Cardiology Problems   Essential hypertension (Chronic)   Blood pressure elevated at 148 mmHg. Currently only on Bisoprolol-hydrochlorothiazide (10-6.25 mg). Lisinopril  likely discontinued due to dry cough. Consider ARB to avoid side effects. - Add Avapro  (irbesartan ) 75 mg in the evening to replace lisinopril . - Check chemistry panel two weeks after starting Avapro .      Relevant Medications   bisoprolol-hydrochlorothiazide (ZIAC) 10-6.25 MG tablet   irbesartan  (AVAPRO ) 75 MG tablet   Other Relevant Orders   EKG 12-Lead (Completed)   Comprehensive metabolic panel with GFR   Catecholamines,  fractionated, urine, 24 hour   Metanephrines, urine, 24 hour   Vasomotor flushing   Episodes of flushing, diaphoresis, and fatigue (possible pheochromocytoma or other potential neurohormonal condition) Daily episodes of flushing, diaphoresis, and fatigue with palpitations. Symptoms atypical for pheochromocytoma but require further evaluation. - Request previous lab results from [facility] and [facility]. - Will pheochromocytoma with 24-hour urine metanephrines.      Relevant Medications   bisoprolol-hydrochlorothiazide (ZIAC) 10-6.25 MG tablet   irbesartan  (AVAPRO ) 75 MG tablet   Other Relevant Orders   Catecholamines, fractionated, urine, 24 hour   Metanephrines, urine, 24 hour     Other   Rapid or irregular heartbeat - Primary   Palpitations described as fluttering and forceful beats, more frequent at night.  Previous catheterization showed no significant blockage.  Holter monitor indicated benign premature beats without atrial fibrillation.  Likely due to PVCs/PVCs with Brockenbrough beats following compensatory pause. - Obtain previous studies from [facility] to avoid redundant testing. - Continue bisoprolol-HCTZ (10 mg - 6.25 mg twice daily)  Other Visit Diagnoses       Medication management       Relevant Orders   Comprehensive metabolic panel with GFR   Catecholamines, fractionated, urine, 24 hour   Metanephrines, urine, 24 hour               Follow-Up: Return in about 2 months (around 04/12/2024) for Follow-up with APP, Alternate 6 month follow-up with APP & MD. -> Will need to have records from [facility].     Signed, Alm MICAEL Clay, MD, MS Alm Clay, M.D., M.S. Interventional Chartered certified accountant  Pager # [phone]

## 2024-02-11 ENCOUNTER — Encounter: Payer: Self-pay | Admitting: Cardiology

## 2024-02-11 ENCOUNTER — Other Ambulatory Visit: Payer: Self-pay | Admitting: *Deleted

## 2024-02-11 ENCOUNTER — Ambulatory Visit: Attending: Cardiology | Admitting: Cardiology

## 2024-02-11 VITALS — BP 142/78 | HR 51 | Ht 62.0 in | Wt 160.8 lb

## 2024-02-11 DIAGNOSIS — R232 Flushing: Secondary | ICD-10-CM

## 2024-02-11 DIAGNOSIS — I10 Essential (primary) hypertension: Secondary | ICD-10-CM | POA: Diagnosis not present

## 2024-02-11 DIAGNOSIS — R000 Tachycardia, unspecified: Secondary | ICD-10-CM

## 2024-02-11 DIAGNOSIS — Z79899 Other long term (current) drug therapy: Secondary | ICD-10-CM

## 2024-02-11 MED ORDER — IRBESARTAN 75 MG PO TABS: 75.0000 mg | ORAL_TABLET | Freq: Every day | ORAL | 3 refills | Status: DC

## 2024-02-11 NOTE — Patient Instructions (Addendum)
 Medication Instructions:   Stop Lisinopril    Start Irbesartan 75 mg daily  *If you need a refill on your cardiac medications before your next appointment, please call your pharmacy*   Lab Work: CMP -- 2 weeks from starting Irbesartan 75 mg  24 hr urine metanephrines  24 hr urine catecholamines  If you have labs (blood work) drawn today and your tests are completely normal, you will receive your results only by: MyChart Message (if you have MyChart) OR A paper copy in the mail If you have any lab test that is abnormal or we need to change your treatment, we will call you to review the results.   Testing/Procedures: Not needed   Follow-Up: At [facility], you and your health needs are our priority.  As part of our continuing mission to provide you with exceptional heart care, we have created designated Provider Care Teams.  These Care Teams include your primary Cardiologist (physician) and Advanced Practice Providers (APPs -  Physician Assistants and Nurse Practitioners) who all work together to provide you with the care you need, when you need it.     Your next appointment:   2 month(s)  The format for your next appointment:   In Person  Provider:   Aline Door, PA-C, Damien Braver, NP, or Katlyn West, NP      Then, Alm Clay, MD will plan to see you again in 6 month(s).

## 2024-02-13 ENCOUNTER — Encounter: Payer: Self-pay | Admitting: Cardiology

## 2024-02-13 DIAGNOSIS — R232 Flushing: Secondary | ICD-10-CM | POA: Insufficient documentation

## 2024-02-13 NOTE — Assessment & Plan Note (Signed)
 Blood pressure elevated at 148 mmHg. Currently only on Bisoprolol-hydrochlorothiazide (10-6.25 mg). Lisinopril  likely discontinued due to dry cough. Consider ARB to avoid side effects. - Add Avapro  (irbesartan ) 75 mg in the evening to replace lisinopril . - Check chemistry panel two weeks after starting Avapro .

## 2024-02-13 NOTE — Assessment & Plan Note (Signed)
 Episodes of flushing, diaphoresis, and fatigue (possible pheochromocytoma or other potential neurohormonal condition) Daily episodes of flushing, diaphoresis, and fatigue with palpitations. Symptoms atypical for pheochromocytoma but require further evaluation. - Request previous lab results from [facility] and [facility]. - Will pheochromocytoma with 24-hour urine metanephrines.

## 2024-02-13 NOTE — Assessment & Plan Note (Signed)
 Palpitations described as fluttering and forceful beats, more frequent at night.  Previous catheterization showed no significant blockage.  Holter monitor indicated benign premature beats without atrial fibrillation.  Likely due to PVCs/PVCs with Brockenbrough beats following compensatory pause. - Obtain previous studies from [facility] to avoid redundant testing. - Continue bisoprolol-HCTZ (10 mg - 6.25 mg twice daily)

## 2024-02-29 LAB — COMPREHENSIVE METABOLIC PANEL WITH GFR
ALT: 56 IU/L — AB (ref 0–32)
AST: 43 IU/L — AB (ref 0–40)
Albumin: 4.3 g/dL (ref 3.7–4.7)
Alkaline Phosphatase: 128 IU/L — AB (ref 44–121)
BUN/Creatinine Ratio: 12 (ref 12–28)
BUN: 10 mg/dL (ref 8–27)
Bilirubin Total: 1.8 mg/dL — AB (ref 0.0–1.2)
CO2: 25 mmol/L (ref 20–29)
Calcium: 9.8 mg/dL (ref 8.7–10.3)
Chloride: 96 mmol/L (ref 96–106)
Creatinine, Ser: 0.84 mg/dL (ref 0.57–1.00)
Globulin, Total: 2.1 g/dL (ref 1.5–4.5)
Glucose: 125 mg/dL — ABNORMAL HIGH (ref 70–99)
Potassium: 4.4 mmol/L (ref 3.5–5.2)
Sodium: 135 mmol/L (ref 134–144)
Total Protein: 6.4 g/dL (ref 6.0–8.5)
eGFR: 70 mL/min/1.73 (ref >59)

## 2024-03-03 ENCOUNTER — Ambulatory Visit: Payer: Self-pay | Admitting: Cardiology

## 2024-03-03 LAB — METANEPHRINES, URINE, 24 HOUR
Metaneph Total, Ur: 27 ug/L
Metanephrines, 24H Ur: 81 ug/(24.h) (ref 36–209)
Normetanephrine, 24H Ur: 228 ug/(24.h) (ref 131–612)
Normetanephrine, Ur: 76 ug/L

## 2024-03-03 LAB — CATECHOLAMINES, FRACTIONATED, URINE, 24 HOUR

## 2024-03-17 ENCOUNTER — Telehealth: Payer: Self-pay | Admitting: Cardiology

## 2024-03-17 MED ORDER — BISOPROLOL-HYDROCHLOROTHIAZIDE 10-6.25 MG PO TABS: 1.0000 | ORAL_TABLET | Freq: Every day | ORAL | 3 refills | Status: DC

## 2024-03-17 NOTE — Telephone Encounter (Signed)
 Pt's medication was sent to pt's pharmacy as requested. Confirmation received.
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
## 2024-04-10 NOTE — Progress Notes (Unsigned)
 Cardiology Office Note    Date:  04/10/2024  ID:  Kristina Richard, DOB 1941-04-09, MRN [id number] PCP:  Valentin Skates, DO  Cardiologist:  Alm Clay, MD  Electrophysiologist:  None   Chief Complaint: ***  History of Present Illness: Kristina Richard is a 83 y.o. female with visit-pertinent history of hypertension, hyperlipidemia, prediabetes mellitus and PAT.  Patient was previously seen by Dr. Clay in January 2024 for symptoms concerning for class III angina, evaluated the Myoview stress test that was positive.  Cardiac catheterization on August 07, 2022 revealed minimal CAD.  She was noted to have PACs and runs of PAT on her monitor.  Patient was restarted on low-dose beta-blocker Toprol  25 mg daily.  Patient was seen by Dr. Clay on 02/11/2024, she reported experiencing palpitations that were more noticeable at night described as flutter bites with decreased frequency during the day.  These episodes were accompanied by fatigue, heavy sweating and shortness of breath occurring daily.  Patient's symptoms had shown some improvement with change to bisoprolol .  Previously she was on metoprolol  lisinopril  but was switched to bisoprolol  HCTZ 10-6.25 mg daily.  Patient reported having a bad cough, likely the reason for discontinuation of lisinopril .  Patient also reported feeling episodes of feeling flushed and tired, accompanied by sensation of heart racing, particularly after exercising with chair yoga.  She reported a squeezing sensation in her chest that made her feel like she was choking and lightheaded occurring unpredictably.  Patient was started on irbesartan  75 mg in the evening.  Today she presents for follow-up.  She reports that she  HTN: Blood pressure today   Palpitations:   Vasomotor flushing: At last office visit patient reported episodes of flushing, diaphoresis and fatigue, concern for possible pheochromocytoma or other potential neurohormonal condition.   Patient urine metanephrine test was negative.  Labwork independently reviewed:   ROS: .   *** denies chest pain, shortness of breath, lower extremity edema, fatigue, palpitations, melena, hematuria, hemoptysis, diaphoresis, weakness, presyncope, syncope, orthopnea, and PND.  All other systems are reviewed and otherwise negative.  Studies Reviewed: SABRA    EKG:  EKG is ordered today, personally reviewed, demonstrating ***     CV Studies: Cardiac studies reviewed are outlined and summarized above. Otherwise please see EMR for full report. Cardiac Studies & Procedures   ______________________________________________________________________________________________ CARDIAC CATHETERIZATION  CARDIAC CATHETERIZATION 08/07/2022  Conclusion   Ost LAD to Mid LAD lesion is 5% stenosed & Mid LAD lesion is 15% stenosed.(calcified)   Mid RCA to Dist RCA lesion is 30% stenosed.   The left ventricular systolic function is normal.  The left ventricular ejection fraction is 55-65% by visual estimate.   LV end diastolic pressure is normal.  POST-OPERATIVE DIAGNOSIS: FALSE POSITIVE STRESS TEST Angiographically Minimal Coronary Disease: Mild diffuse disease in the RCA with no significant lesion.  30% mid-distal lesion.  Small PDA major PL branch. Diffuse proximal LAD calcification with no significant stenosis.  At most 15% mid LAD with several small diagonal branches. LCx gives rise to a high OM branch that courses as a Ramus Intermedius followed by 1 tiny OM and 2 smaller lateral OM branches with mild diffuse disease. EF 55 to 60% normal wall motion.  Normal EDP.  PLAN OF CARE: Discharge to home after PACU She will be discharged after bedrest, continue medical management and fo getting more palpitations    Alm Clay, MD  Findings Coronary Findings Diagnostic  Dominance: Right  Left Main  Left Anterior Descending Ost LAD to Mid LAD lesion is 5% stenosed. The lesion is mildly calcified. Mid  LAD lesion is 15% stenosed.  First Diagonal Branch Vessel is small in size.  First Septal Branch Vessel is small in size.  Second Diagonal Branch Vessel is small in size.  Third Diagonal Branch Vessel is small in size.  Left Anterior Descending Ost LAD to Mid LAD lesion is 5% stenosed. The lesion is mildly calcified. Mid LAD lesion is 15% stenosed.  First Diagonal Branch Vessel is small in size.  First Septal Branch Vessel is small in size.  Second Diagonal Branch Vessel is small in size.  Third Diagonal Branch Vessel is small in size.  Left Circumflex  Second Obtuse Marginal Branch Vessel is small in size.  Third Obtuse Marginal Branch There is mild disease in the vessel.  Left Posterior Atrioventricular Artery Vessel is small in size.  Right Coronary Artery Vessel was injected. Vessel is normal in caliber and moderate in size. There is mild focal disease in the vessel. Mid RCA to Dist RCA lesion is 30% stenosed.  Acute Marginal Branch Vessel is small in size.  Right Ventricular Branch Vessel is small in size.  Right Posterior Descending Artery Vessel is small in size.  First Right Posterolateral Branch Vessel is small in size.  Third Right Posterolateral Branch Vessel is moderate in size.  Intervention  No interventions have been documented.              ______________________________________________________________________________________________       Current Reported Medications:.    No outpatient medications have been marked as taking for the 04/13/24 encounter (Appointment) with Fantashia Shupert D, NP.    Physical Exam:    VS:  There were no vitals taken for this visit.   Wt Readings from Last 3 Encounters:  02/11/24 160 lb 12.8 oz (72.9 kg)  08/07/22 154 lb (69.9 kg)  07/29/22 154 lb 9.6 oz (70.1 kg)    GEN: Well nourished, well developed in no acute distress NECK: No JVD; No carotid bruits CARDIAC: ***RRR, no murmurs, rubs,  gallops RESPIRATORY:  Clear to auscultation without rales, wheezing or rhonchi  ABDOMEN: Soft, non-tender, non-distended EXTREMITIES:  No edema; No acute deformity     Asessement and Plan:.     ***     Disposition: F/u with ***  Signed, Haddon Fyfe D Montey Ebel, NP

## 2024-04-13 ENCOUNTER — Ambulatory Visit: Attending: Cardiology | Admitting: Cardiology

## 2024-04-13 ENCOUNTER — Other Ambulatory Visit: Payer: Self-pay

## 2024-04-13 ENCOUNTER — Encounter: Payer: Self-pay | Admitting: Cardiology

## 2024-04-13 ENCOUNTER — Other Ambulatory Visit (HOSPITAL_COMMUNITY): Payer: Self-pay

## 2024-04-13 VITALS — BP 142/82 | HR 60 | Ht 62.0 in | Wt 166.0 lb

## 2024-04-13 DIAGNOSIS — R0602 Shortness of breath: Secondary | ICD-10-CM | POA: Diagnosis not present

## 2024-04-13 DIAGNOSIS — R232 Flushing: Secondary | ICD-10-CM

## 2024-04-13 DIAGNOSIS — R000 Tachycardia, unspecified: Secondary | ICD-10-CM | POA: Diagnosis not present

## 2024-04-13 DIAGNOSIS — I10 Essential (primary) hypertension: Secondary | ICD-10-CM

## 2024-04-13 DIAGNOSIS — R600 Localized edema: Secondary | ICD-10-CM | POA: Diagnosis not present

## 2024-04-13 MED ORDER — HYDROCHLOROTHIAZIDE 12.5 MG PO CAPS
12.5000 mg | ORAL_CAPSULE | Freq: Every day | ORAL | 1 refills | Status: DC
  Filled 2024-04-13: qty 30, 30d supply, fill #0

## 2024-04-13 MED ORDER — BISOPROLOL FUMARATE 10 MG PO TABS
10.0000 mg | ORAL_TABLET | Freq: Every day | ORAL | 1 refills | Status: DC
  Filled 2024-04-13: qty 30, 30d supply, fill #0

## 2024-04-13 NOTE — Patient Instructions (Signed)
 Medication Instructions:  STOP Bisoprolol -hydrochlorothiazide  START Bisoprolol  10mg  Take 1 tablet once a day  START Hydrochlorothiazide  12.5mg  Take 1 tablet once a day (tablet may come in a 25mg  and if so you will need to cut it in half and take half tablet daily) *If you need a refill on your cardiac medications before your next appointment, please call your pharmacy*  Lab Work: TODAY-CMET 2 WEEKS BMET (04/27/2024) If you have labs (blood work) drawn today and your tests are completely normal, you will receive your results only by: MyChart Message (if you have MyChart) OR A paper copy in the mail If you have any lab test that is abnormal or we need to change your treatment, we will call you to review the results.  Testing/Procedures: Your physician has requested that you have an echocardiogram. Echocardiography is a painless test that uses sound waves to create images of your heart. It provides your doctor with information about the size and shape of your heart and how well your heart's chambers and valves are working. This procedure takes approximately one hour. There are no restrictions for this procedure. Please do NOT wear cologne, perfume, aftershave, or lotions (deodorant is allowed). Please arrive 15 minutes prior to your appointment time.  Please note: We ask at that you not bring children with you during ultrasound (echo/ vascular) testing. Due to room size and safety concerns, children are not allowed in the ultrasound rooms during exams. Our front office staff cannot provide observation of children in our lobby area while testing is being conducted. An adult accompanying a patient to their appointment will only be allowed in the ultrasound room at the discretion of the ultrasound technician under special circumstances. We apologize for any inconvenience.  Follow-Up: At [facility], you and your health needs are our priority.  As part of our continuing mission to provide  you with exceptional heart care, our providers are all part of one team.  This team includes your primary Cardiologist (physician) and Advanced Practice Providers or APPs (Physician Assistants and Nurse Practitioners) who all work together to provide you with the care you need, when you need it.  Your next appointment:   6 week(s)  Provider:   Katlyn West, NP        We recommend signing up for the patient portal called MyChart.  Sign up information is provided on this After Visit Summary.  MyChart is used to connect with patients for Virtual Visits (Telemedicine).  Patients are able to view lab/test results, encounter notes, upcoming appointments, etc.  Non-urgent messages can be sent to your provider as well.   To learn more about what you can do with MyChart, go to [email or web address].   Other Instructions

## 2024-04-14 LAB — COMPREHENSIVE METABOLIC PANEL WITH GFR
ALT: 76 IU/L — AB (ref 0–32)
AST: 58 IU/L — AB (ref 0–40)
Albumin: 4.3 g/dL (ref 3.7–4.7)
Alkaline Phosphatase: 201 IU/L — AB (ref 48–129)
BUN/Creatinine Ratio: 10 — AB (ref 12–28)
BUN: 7 mg/dL — AB (ref 8–27)
Bilirubin Total: 1.1 mg/dL (ref 0.0–1.2)
CO2: 25 mmol/L (ref 20–29)
Calcium: 9.3 mg/dL (ref 8.7–10.3)
Chloride: 96 mmol/L (ref 96–106)
Creatinine, Ser: 0.67 mg/dL (ref 0.57–1.00)
Globulin, Total: 2.3 g/dL (ref 1.5–4.5)
Glucose: 91 mg/dL (ref 70–99)
Potassium: 4.7 mmol/L (ref 3.5–5.2)
Sodium: 135 mmol/L (ref 134–144)
Total Protein: 6.6 g/dL (ref 6.0–8.5)
eGFR: 88 mL/min/1.73 (ref >59)

## 2024-04-18 ENCOUNTER — Ambulatory Visit: Payer: Self-pay | Admitting: Cardiology

## 2024-04-18 DIAGNOSIS — R000 Tachycardia, unspecified: Secondary | ICD-10-CM

## 2024-04-18 DIAGNOSIS — I10 Essential (primary) hypertension: Secondary | ICD-10-CM

## 2024-04-18 DIAGNOSIS — R7989 Other specified abnormal findings of blood chemistry: Secondary | ICD-10-CM

## 2024-04-20 NOTE — Addendum Note (Signed)
 Addended by: LORRENE FEDERICO CROME on: 04/20/2024 08:08 AM   Modules accepted: Orders

## 2024-04-28 LAB — BASIC METABOLIC PANEL WITH GFR
BUN/Creatinine Ratio: 14 (ref 12–28)
BUN: 10 mg/dL (ref 8–27)
CO2: 25 mmol/L (ref 20–29)
Calcium: 9.4 mg/dL (ref 8.7–10.3)
Chloride: 92 mmol/L — ABNORMAL LOW (ref 96–106)
Creatinine, Ser: 0.74 mg/dL (ref 0.57–1.00)
Glucose: 140 mg/dL — ABNORMAL HIGH (ref 70–99)
Potassium: 4.1 mmol/L (ref 3.5–5.2)
Sodium: 132 mmol/L — ABNORMAL LOW (ref 134–144)
eGFR: 81 mL/min/1.73 (ref >59)

## 2024-04-28 LAB — HEPATIC FUNCTION PANEL
ALT: 38 IU/L — ABNORMAL HIGH (ref 0–32)
AST: 30 IU/L (ref 0–40)
Albumin: 4.2 g/dL (ref 3.7–4.7)
Alkaline Phosphatase: 176 IU/L — ABNORMAL HIGH (ref 48–129)
Bilirubin Total: 0.8 mg/dL (ref 0.0–1.2)
Bilirubin, Direct: 0.24 mg/dL (ref 0.00–0.40)
Total Protein: 6.2 g/dL (ref 6.0–8.5)

## 2024-05-04 ENCOUNTER — Other Ambulatory Visit: Payer: Self-pay

## 2024-05-04 DIAGNOSIS — R000 Tachycardia, unspecified: Secondary | ICD-10-CM

## 2024-05-04 DIAGNOSIS — I10 Essential (primary) hypertension: Secondary | ICD-10-CM

## 2024-05-04 NOTE — Addendum Note (Signed)
 Addended by: Kennis Buell on: 05/04/2024 04:25 PM   Modules accepted: Orders

## 2024-05-11 ENCOUNTER — Other Ambulatory Visit (HOSPITAL_COMMUNITY): Payer: Self-pay

## 2024-05-20 LAB — BASIC METABOLIC PANEL WITH GFR
BUN/Creatinine Ratio: 14 (ref 12–28)
BUN: 10 mg/dL (ref 8–27)
CO2: 26 mmol/L (ref 20–29)
Calcium: 9.4 mg/dL (ref 8.7–10.3)
Chloride: 95 mmol/L — ABNORMAL LOW (ref 96–106)
Creatinine, Ser: 0.71 mg/dL (ref 0.57–1.00)
Glucose: 93 mg/dL (ref 70–99)
Potassium: 4.2 mmol/L (ref 3.5–5.2)
Sodium: 134 mmol/L (ref 134–144)
eGFR: 85 mL/min/1.73 (ref >59)

## 2024-05-26 ENCOUNTER — Ambulatory Visit (HOSPITAL_COMMUNITY)
Admission: RE | Admit: 2024-05-26 | Discharge: 2024-05-26 | Disposition: A | Discharge status: Home / Self Care | Source: Ambulatory Visit | Attending: Cardiology | Admitting: Cardiology

## 2024-05-26 DIAGNOSIS — R0602 Shortness of breath: Secondary | ICD-10-CM | POA: Insufficient documentation

## 2024-05-26 DIAGNOSIS — I10 Essential (primary) hypertension: Secondary | ICD-10-CM | POA: Diagnosis present

## 2024-05-26 DIAGNOSIS — R000 Tachycardia, unspecified: Secondary | ICD-10-CM | POA: Diagnosis present

## 2024-05-26 DIAGNOSIS — R600 Localized edema: Secondary | ICD-10-CM | POA: Insufficient documentation

## 2024-05-26 LAB — ECHOCARDIOGRAM COMPLETE
Area-P 1/2: 3.08 cm2
S' Lateral: 2.9 cm

## 2024-05-28 NOTE — Progress Notes (Unsigned)
 Cardiology Office Note    Date:  05/29/2024  ID:  Kristina Richard, DOB May 22, 1941, MRN [id number] PCP:  Valentin Skates, DO  Cardiologist:  Alm Clay, MD  Electrophysiologist:  None   Chief Complaint: Follow up for HTN   History of Present Illness: Kristina Richard is a 83 y.o. female with visit-pertinent history of hypertension, hyperlipidemia, prediabetes mellitus and PAT.   Patient was previously seen by Dr. Clay in January 2024 for symptoms concerning for class III angina, evaluated the Myoview stress test that was positive.  Cardiac catheterization on August 07, 2022 revealed minimal CAD.  She was noted to have PACs and runs of PAT on her monitor.  Patient was restarted on low-dose beta-blocker Toprol  25 mg daily.   Patient was seen by Dr. Clay on 02/11/2024, she reported experiencing palpitations that were more noticeable at night described as flutter bites with decreased frequency during the day.  These episodes were accompanied by fatigue, heavy sweating and shortness of breath occurring daily.  Patient's symptoms had shown some improvement with change to bisoprolol .  Previously she was on metoprolol  lisinopril  but was switched to bisoprolol  HCTZ 10-6.25 mg daily.  Patient reported having a bad cough, likely the reason for discontinuation of lisinopril .  Patient also reported feeling episodes of feeling flushed and tired, accompanied by sensation of heart racing, particularly after exercising with chair yoga.  She reported a squeezing sensation in her chest that made her feel like she was choking and lightheaded occurring unpredictably.  Patient was started on irbesartan  75 mg in the evening.  Patient was last seen in clinic on 04/13/2024 for follow-up.  She reported to them doing well overall.  She denied any significant chest pain.  Initially she reported that her palpitations had improved however then reported she did seldom on occasion with associated shortness of  breath.  She felt as though she had improvement switching from Toprol  to bisoprolol  but noted they still persisted.  Patient noted difficulty regarding pricing of her blood pressure medications, was unable to afford bisoprolol  with HCTZ.  Patient was started on bisoprolol  10 mg daily and hydrochlorothiazide  12.5 mg daily separate medications, continued on irbesartan  75 mg daily.  Echocardiogram was ordered for further evaluation.  Echocardiogram on 05/26/2024 indicated LVEF 55 to 60%, no RWMA, G1 DD, RV systolic function and size was normal, normal PASP, trivial mitral valve regurgitation with no evidence of stenosis, aortic valve regurgitation is not visualized, sclerosis present without evidence of stenosis.  Today she presents for follow-up with her sister.  She reports that she has been doing well overall. She feels the bisoprolol  has controlled her palpitations well.  She denies any chest pain, notes some dyspnea on exertion which she feels is related to deconditioning and aging.  She denies any significant lower extremity edema, orthopnea or PND.  Patient notes some intermittent elevated blood pressures at home, reports that she has not been monitoring it regularly although notes some episodes with systolic in the 160s, has also noted some slightly low heart rates in the low 50s in upper 40s.  Denies any increased fatigue, presyncope or syncope.  Patient previously directed to stop use of statin medications given elevated LFTs, reports she has not been taking.  Patient notes that she would prefer a more natural remedy for her cholesterol and would prefer not to be on statin medications unless absolutely necessary. ROS: .   Today she denies chest pain, lower extremity edema, fatigue, palpitations, melena,  hematuria, hemoptysis, diaphoresis, weakness, presyncope, syncope, orthopnea, and PND.  All other systems are reviewed and otherwise negative. Studies Reviewed: SABRA   EKG:  EKG is not ordered today.   CV Studies: Cardiac studies reviewed are outlined and summarized above. Otherwise please see EMR for full report. Cardiac Studies & Procedures   ______________________________________________________________________________________________ CARDIAC CATHETERIZATION  CARDIAC CATHETERIZATION 08/07/2022  Conclusion   Ost LAD to Mid LAD lesion is 5% stenosed & Mid LAD lesion is 15% stenosed.(calcified)   Mid RCA to Dist RCA lesion is 30% stenosed.   The left ventricular systolic function is normal.  The left ventricular ejection fraction is 55-65% by visual estimate.   LV end diastolic pressure is normal.  POST-OPERATIVE DIAGNOSIS: FALSE POSITIVE STRESS TEST Angiographically Minimal Coronary Disease: Mild diffuse disease in the RCA with no significant lesion.  30% mid-distal lesion.  Small PDA major PL branch. Diffuse proximal LAD calcification with no significant stenosis.  At most 15% mid LAD with several small diagonal branches. LCx gives rise to a high OM branch that courses as a Ramus Intermedius followed by 1 tiny OM and 2 smaller lateral OM branches with mild diffuse disease. EF 55 to 60% normal wall motion.  Normal EDP.  PLAN OF CARE: Discharge to home after PACU She will be discharged after bedrest, continue medical management and fo getting more palpitations    Alm Clay, MD  Findings Coronary Findings Diagnostic  Dominance: Right  Left Main  Left Anterior Descending Ost LAD to Mid LAD lesion is 5% stenosed. The lesion is mildly calcified. Mid LAD lesion is 15% stenosed.  First Diagonal Branch Vessel is small in size.  First Septal Branch Vessel is small in size.  Second Diagonal Branch Vessel is small in size.  Third Diagonal Branch Vessel is small in size.  Left Anterior Descending Ost LAD to Mid LAD lesion is 5% stenosed. The lesion is mildly calcified. Mid LAD lesion is 15% stenosed.  First Diagonal Branch Vessel is small in size.  First Septal  Branch Vessel is small in size.  Second Diagonal Branch Vessel is small in size.  Third Diagonal Branch Vessel is small in size.  Left Circumflex  Second Obtuse Marginal Branch Vessel is small in size.  Third Obtuse Marginal Branch There is mild disease in the vessel.  Left Posterior Atrioventricular Artery Vessel is small in size.  Right Coronary Artery Vessel was injected. Vessel is normal in caliber and moderate in size. There is mild focal disease in the vessel. Mid RCA to Dist RCA lesion is 30% stenosed.  Acute Marginal Branch Vessel is small in size.  Right Ventricular Branch Vessel is small in size.  Right Posterior Descending Artery Vessel is small in size.  First Right Posterolateral Branch Vessel is small in size.  Third Right Posterolateral Branch Vessel is moderate in size.  Intervention  No interventions have been documented.     ECHOCARDIOGRAM  ECHOCARDIOGRAM COMPLETE 05/26/2024  Narrative ECHOCARDIOGRAM REPORT    Patient Name:   CORALYNN GAONA Date of Exam: 05/26/2024 Medical Rec #:  [id number]         Height:       62.0 in Accession #:    [id number]        Weight:       166.0 lb Date of Birth:  [birth year]         BSA:          1.766 m Patient Age:    89 years  BP:           186/89 mmHg Patient Gender: F                 HR:           66 bpm. Exam Location:  [address]  Procedure: 2D Echo, Cardiac Doppler and Color Doppler (Both Spectral and Color Flow Doppler were utilized during procedure).  Indications:    Dyspnea R06.00  History:        Patient has no prior history of Echocardiogram examinations. Risk Factors:Hypertension.  Sonographer:    Augustin Seals RDCS Referring Phys: [id number] Simi Briel D Hermenia Fritcher  IMPRESSIONS   1. Left ventricular ejection fraction, by estimation, is 55 to 60%. The left ventricle has normal function. The left ventricle has no regional wall motion abnormalities. Left ventricular diastolic  parameters are consistent with Grade I diastolic dysfunction (impaired relaxation). 2. Right ventricular systolic function is normal. The right ventricular size is normal. There is normal pulmonary artery systolic pressure. 3. The mitral valve is normal in structure. Trivial mitral valve regurgitation. No evidence of mitral stenosis. 4. Tricuspid valve regurgitation is mild to moderate. 5. The aortic valve is tricuspid. Aortic valve regurgitation is not visualized. Aortic valve sclerosis is present, with no evidence of aortic valve stenosis. 6. The inferior vena cava is normal in size with greater than 50% respiratory variability, suggesting right atrial pressure of 3 mmHg.  FINDINGS Left Ventricle: Left ventricular ejection fraction, by estimation, is 55 to 60%. The left ventricle has normal function. The left ventricle has no regional wall motion abnormalities. The left ventricular internal cavity size was normal in size. There is no left ventricular hypertrophy. Left ventricular diastolic parameters are consistent with Grade I diastolic dysfunction (impaired relaxation).  Right Ventricle: The right ventricular size is normal. Right ventricular systolic function is normal. There is normal pulmonary artery systolic pressure. The tricuspid regurgitant velocity is 2.83 m/s, and with an assumed right atrial pressure of 3 mmHg, the estimated right ventricular systolic pressure is 35.0 mmHg.  Left Atrium: Left atrial size was normal in size.  Right Atrium: Right atrial size was normal in size.  Pericardium: There is no evidence of pericardial effusion.  Mitral Valve: The mitral valve is normal in structure. Trivial mitral valve regurgitation. No evidence of mitral valve stenosis.  Tricuspid Valve: The tricuspid valve is normal in structure. Tricuspid valve regurgitation is mild to moderate. No evidence of tricuspid stenosis.  Aortic Valve: The aortic valve is tricuspid. Aortic valve  regurgitation is not visualized. Aortic valve sclerosis is present, with no evidence of aortic valve stenosis.  Pulmonic Valve: The pulmonic valve was normal in structure. Pulmonic valve regurgitation is trivial. No evidence of pulmonic stenosis.  Aorta: The aortic root is normal in size and structure.  Venous: The inferior vena cava is normal in size with greater than 50% respiratory variability, suggesting right atrial pressure of 3 mmHg.  IAS/Shunts: No atrial level shunt detected by color flow Doppler.  Additional Comments: 3D was performed not requiring image post processing on an independent workstation and was normal.   LEFT VENTRICLE PLAX 2D LVIDd:         4.40 cm   Diastology LVIDs:         2.90 cm   LV e' medial:    6.31 cm/s LV PW:         0.90 cm   LV E/e' medial:  10.4 LV IVS:  0.80 cm   LV e' lateral:   9.46 cm/s LVOT diam:     2.00 cm   LV E/e' lateral: 6.9 LV SV:         54 LV SV Index:   30 LVOT Area:     3.14 cm  3D Volume EF: 3D EF:        60 % LV EDV:       80 ml LV ESV:       32 ml LV SV:        48 ml  RIGHT VENTRICLE             IVC RV Basal diam:  3.10 cm     IVC diam: 1.40 cm RV Mid diam:    2.00 cm RV S prime:     12.70 cm/s TAPSE (M-mode): 2.2 cm  LEFT ATRIUM           Index        RIGHT ATRIUM           Index LA diam:      2.80 cm 1.59 cm/m   RA Area:     10.60 cm LA Vol (A2C): 41.3 ml 23.38 ml/m  RA Volume:   21.70 ml  12.29 ml/m LA Vol (A4C): 10.3 ml 5.83 ml/m AORTIC VALVE LVOT Vmax:   65.80 cm/s LVOT Vmean:  44.500 cm/s LVOT VTI:    0.171 m  AORTA Ao Root diam: 3.10 cm Ao Asc diam:  3.70 cm  MITRAL VALVE               TRICUSPID VALVE MV Area (PHT): 3.08 cm    TR Peak grad:   32.0 mmHg MV Decel Time: 246 msec    TR Vmax:        283.00 cm/s MV E velocity: 65.50 cm/s MV A velocity: 74.90 cm/s  SHUNTS MV E/A ratio:  0.87        Systemic VTI:  0.17 m Systemic Diam: 2.00 cm  Redell Shallow MD Electronically signed by Redell Shallow MD Signature Date/Time: 05/26/2024/2:04:14 PM    Final          ______________________________________________________________________________________________       Current Reported Medications:.    Current Meds  Medication Sig   Ascorbic Acid 500 MG CAPS as directed Orally   aspirin  EC 81 MG tablet Take 1 tablet (81 mg total) by mouth daily. Swallow whole.   bisoprolol  (ZEBETA ) 10 MG tablet Take 1 tablet (10 mg total) by mouth daily.   cholecalciferol  (VITAMIN D3) 25 MCG (1000 UNIT) tablet Take 3,000 Units by mouth 3 (three) times a week.   hydrochlorothiazide  (MICROZIDE ) 12.5 MG capsule Take 1 capsule (12.5 mg total) by mouth daily.   irbesartan  (AVAPRO ) 150 MG tablet Take 1 tablet (150 mg total) by mouth daily.   LORazepam  (ATIVAN ) 2 MG tablet TAKE 1/2 TO 1 TABLET BY MOUTH AT BEDTIME AS NEEDED FOR INSOMINIA   Lysine HCl (L-FORMULA LYSINE HCL) 500 MG TABS Take 500 mg by mouth daily.   magnesium gluconate (MAGONATE) 500 MG tablet Take 500 mg by mouth daily.   Turmeric 500 MG CAPS Take by mouth.   Zinc 50 MG TABS 1 tablet Orally Once a day   [DISCONTINUED] irbesartan  (AVAPRO ) 75 MG tablet Take 1 tablet (75 mg total) by mouth daily.   Physical Exam:    VS:  BP (!) 142/78   Pulse (!) 55   Ht 5' 2 (1.575 m)  Wt 169 lb (76.7 kg)   SpO2 98%   BMI 30.91 kg/m    Wt Readings from Last 3 Encounters:  05/29/24 169 lb (76.7 kg)  04/13/24 166 lb (75.3 kg)  02/11/24 160 lb 12.8 oz (72.9 kg)    GEN: Well nourished, well developed in no acute distress NECK: No JVD; No carotid bruits CARDIAC: RRR, no murmurs, rubs, gallops RESPIRATORY:  Clear to auscultation without rales, wheezing or rhonchi  ABDOMEN: Soft, non-tender, non-distended EXTREMITIES:  No edema; No acute deformity     Asessement and Plan:.    HTN: Blood pressure today initially 154/80, on recheck 142/78.  Patient reports that she has not been regularly monitoring her blood pressure at home.  Notes some  intermittent elevations when she does check with systolics in the 160s.  Given elevated blood pressure today recommend increasing irbesartan  to 150 mg daily, patient agreeable.  Check CMET today and repeat BMET in two weeks.  Continue bisoprolol  10 mg daily, HCTZ 12.5 mg daily.  Palpitations: Patient with history of palpitations described as a fluttering and forceful beats. Per notes prior Holter monitor indicated benign premature beats without atrial fibrillation.  Today she reports that her palpitations have overall been well-controlled on bisoprolol .  She does note some intermittent low heart rates in the upper 40s or low 50s, I have asked patient to continue monitoring her heart rate and blood pressure at home if persistently low will need to consider repeat monitor versus decreasing on bisoprolol .  Continue bisoprolol  10 mg daily.  Hyperlipidemia/elevated LFTs: Patient noted to have increases in LFTs while on statin medications.  Her atorvastatin has been held with improvement, last AST 30, ALT 38.  Today she reports that she prefer not to restart on statin medications at this time, she intends to work on diet and exercise.  Discussed with patient that given evidence of mild CAD would recommend LDL goal be less than 70 and would recommend statin medication, patient verbalized understanding however elected not to restart on statin medications at this time.  She is agreeable to recheck in 3 months. Heart healthy diet and regular cardiovascular exercise encouraged.  Check fasting lipid profile and CMET.   Disposition: F/u with Kabella Cassidy, NP in 6 weeks.   Signed, Karolynn Infantino D Malyn Aytes, NP

## 2024-05-29 ENCOUNTER — Ambulatory Visit: Attending: Cardiology | Admitting: Cardiology

## 2024-05-29 ENCOUNTER — Other Ambulatory Visit (HOSPITAL_COMMUNITY): Payer: Self-pay

## 2024-05-29 ENCOUNTER — Encounter: Payer: Self-pay | Admitting: Cardiology

## 2024-05-29 VITALS — BP 142/78 | HR 55 | Ht 62.0 in | Wt 169.0 lb

## 2024-05-29 DIAGNOSIS — Z79899 Other long term (current) drug therapy: Secondary | ICD-10-CM

## 2024-05-29 DIAGNOSIS — R0602 Shortness of breath: Secondary | ICD-10-CM

## 2024-05-29 DIAGNOSIS — E782 Mixed hyperlipidemia: Secondary | ICD-10-CM

## 2024-05-29 DIAGNOSIS — R7989 Other specified abnormal findings of blood chemistry: Secondary | ICD-10-CM

## 2024-05-29 DIAGNOSIS — I10 Essential (primary) hypertension: Secondary | ICD-10-CM

## 2024-05-29 DIAGNOSIS — R000 Tachycardia, unspecified: Secondary | ICD-10-CM | POA: Diagnosis not present

## 2024-05-29 MED ORDER — IRBESARTAN 150 MG PO TABS
150.0000 mg | ORAL_TABLET | Freq: Every day | ORAL | 3 refills | Status: AC
  Filled 2024-05-29: qty 90, 90d supply, fill #0

## 2024-05-29 NOTE — Patient Instructions (Signed)
 Medication Instructions:  Stop: Irbesartan  75mg  daily Start: Irbesartan  150mg  daily  *If you need a refill on your cardiac medications before your next appointment, please call your pharmacy*  Lab Work: Tomorrow: CMET, Lipid Panel 2 weeks BMET  If you have labs (blood work) drawn today and your tests are completely normal, you will receive your results only by: MyChart Message (if you have MyChart) OR A paper copy in the mail If you have any lab test that is abnormal or we need to change your treatment, we will call you to review the results.  Testing/Procedures: None   Follow-Up: At [facility], you and your health needs are our priority.  As part of our continuing mission to provide you with exceptional heart care, our providers are all part of one team.  This team includes your primary Cardiologist (physician) and Advanced Practice Providers or APPs (Physician Assistants and Nurse Practitioners) who all work together to provide you with the care you need, when you need it.  Your next appointment:   6 week(s)  Provider:   Katlyn West, NP       Other Instructions None

## 2024-05-31 LAB — COMPREHENSIVE METABOLIC PANEL WITH GFR
ALT: 14 IU/L (ref 0–32)
AST: 18 IU/L (ref 0–40)
Albumin: 4.3 g/dL (ref 3.7–4.7)
Alkaline Phosphatase: 87 IU/L (ref 48–129)
BUN/Creatinine Ratio: 11 — ABNORMAL LOW (ref 12–28)
BUN: 9 mg/dL (ref 8–27)
Bilirubin Total: 1.3 mg/dL — ABNORMAL HIGH (ref 0.0–1.2)
CO2: 24 mmol/L (ref 20–29)
Calcium: 9.6 mg/dL (ref 8.7–10.3)
Chloride: 100 mmol/L (ref 96–106)
Creatinine, Ser: 0.8 mg/dL (ref 0.57–1.00)
Globulin, Total: 2.1 g/dL (ref 1.5–4.5)
Glucose: 101 mg/dL — ABNORMAL HIGH (ref 70–99)
Potassium: 4.6 mmol/L (ref 3.5–5.2)
Sodium: 139 mmol/L (ref 134–144)
Total Protein: 6.4 g/dL (ref 6.0–8.5)
eGFR: 74 mL/min/1.73 (ref >59)

## 2024-05-31 LAB — LIPID PANEL
Chol/HDL Ratio: 4 ratio (ref 0.0–4.4)
Cholesterol, Total: 226 mg/dL — ABNORMAL HIGH (ref 100–199)
HDL: 56 mg/dL (ref >39)
LDL Chol Calc (NIH): 156 mg/dL — ABNORMAL HIGH (ref 0–99)
Triglycerides: 78 mg/dL (ref 0–149)
VLDL Cholesterol Cal: 14 mg/dL (ref 5–40)

## 2024-06-02 ENCOUNTER — Ambulatory Visit: Payer: Self-pay | Admitting: Cardiology

## 2024-06-13 ENCOUNTER — Other Ambulatory Visit (HOSPITAL_COMMUNITY): Payer: Self-pay

## 2024-06-13 ENCOUNTER — Other Ambulatory Visit: Payer: Self-pay | Admitting: Cardiology

## 2024-06-13 DIAGNOSIS — R000 Tachycardia, unspecified: Secondary | ICD-10-CM

## 2024-06-13 LAB — BASIC METABOLIC PANEL WITH GFR
BUN/Creatinine Ratio: 11 — ABNORMAL LOW (ref 12–28)
BUN: 8 mg/dL (ref 8–27)
CO2: 26 mmol/L (ref 20–29)
Calcium: 9.6 mg/dL (ref 8.7–10.3)
Chloride: 96 mmol/L (ref 96–106)
Creatinine, Ser: 0.73 mg/dL (ref 0.57–1.00)
Glucose: 107 mg/dL — ABNORMAL HIGH (ref 70–99)
Potassium: 4.6 mmol/L (ref 3.5–5.2)
Sodium: 136 mmol/L (ref 134–144)
eGFR: 83 mL/min/1.73 (ref >59)

## 2024-06-14 ENCOUNTER — Other Ambulatory Visit (HOSPITAL_COMMUNITY): Payer: Self-pay

## 2024-06-14 MED ORDER — BISOPROLOL FUMARATE 10 MG PO TABS
10.0000 mg | ORAL_TABLET | Freq: Every day | ORAL | 3 refills | Status: AC
  Filled 2024-06-14: qty 90, 90d supply, fill #0

## 2024-06-14 NOTE — Telephone Encounter (Signed)
 Pt read message from Katlyn West, NP via MyChart 06/13/24

## 2024-07-12 ENCOUNTER — Ambulatory Visit: Admitting: Cardiology

## 2024-07-12 ENCOUNTER — Encounter: Payer: Self-pay | Admitting: Cardiology

## 2024-07-12 VITALS — BP 128/62 | HR 56 | Ht 62.0 in | Wt 165.4 lb

## 2024-07-12 DIAGNOSIS — I10 Essential (primary) hypertension: Secondary | ICD-10-CM | POA: Diagnosis not present

## 2024-07-12 DIAGNOSIS — E782 Mixed hyperlipidemia: Secondary | ICD-10-CM

## 2024-07-12 DIAGNOSIS — R000 Tachycardia, unspecified: Secondary | ICD-10-CM

## 2024-07-12 NOTE — Patient Instructions (Signed)
 Medication Instructions:  Your physician recommends that you continue on your current medications as directed. Please refer to the Current Medication list given to you today.  *If you need a refill on your cardiac medications before your next appointment, please call your pharmacy*  Lab Work: In 3 months: Fasting Lipids, LFTs  If you have labs (blood work) drawn today and your tests are completely normal, you will receive your results only by: MyChart Message (if you have MyChart) OR A paper copy in the mail If you have any lab test that is abnormal or we need to change your treatment, we will call you to review the results.  Testing/Procedures: NONE  Follow-Up: At [facility], you and your health needs are our priority.  As part of our continuing mission to provide you with exceptional heart care, our providers are all part of one team.  This team includes your primary Cardiologist (physician) and Advanced Practice Providers or APPs (Physician Assistants and Nurse Practitioners) who all work together to provide you with the care you need, when you need it.  Your next appointment:   3-4 month(s)  Provider:   Alm Clay, MD

## 2024-07-12 NOTE — Progress Notes (Signed)
 "  Cardiology Office Note    Date:  07/12/2024  ID:  Kristina, Richard Dec 10, 1940, MRN [id number] PCP:  Kristina Skates, DO  Cardiologist:  Kristina Clay, MD  Electrophysiologist:  None   Chief Complaint: Follow up for HTN  History of Present Illness: Kristina Richard is a 83 y.o. female with visit-pertinent history of hypertension, hyperlipidemia, prediabetes mellitus and PAT.   Patient was previously seen by Dr. Clay in January 2024 for symptoms concerning for class III angina, evaluated the Myoview stress test that was positive.  Cardiac catheterization on August 07, 2022 revealed minimal CAD.  She was noted to have PACs and runs of PAT on her monitor.  Patient was restarted on low-dose beta-blocker Toprol  25 mg daily.   Patient was seen by Dr. Clay on 02/11/2024, she reported experiencing palpitations that were more noticeable at night described as flutter bites with decreased frequency during the day.  These episodes were accompanied by fatigue, heavy sweating and shortness of breath occurring daily.  Patient's symptoms had shown some improvement with change to bisoprolol .  Previously she was on metoprolol  lisinopril  but was switched to bisoprolol  HCTZ 10-6.25 mg daily.  Patient reported having a bad cough, likely the reason for discontinuation of lisinopril .  Patient also reported feeling episodes of feeling flushed and tired, accompanied by sensation of heart racing, particularly after exercising with chair yoga.  She reported a squeezing sensation in her chest that made her feel like she was choking and lightheaded occurring unpredictably.  Patient was started on irbesartan  75 mg in the evening.   Patient was last seen in clinic on 04/13/2024 for follow-up.  She reported to them doing well overall.  She denied any significant chest pain.  Initially she reported that her palpitations had improved however then reported she did seldom on occasion with associated shortness of  breath.  She felt as though she had improvement switching from Toprol  to bisoprolol  but noted they still persisted.  Patient noted difficulty regarding pricing of her blood pressure medications, was unable to afford bisoprolol  with HCTZ.  Patient was started on bisoprolol  10 mg daily and hydrochlorothiazide  12.5 mg daily separate medications, continued on irbesartan  75 mg daily.  Echocardiogram was ordered for further evaluation.   Echocardiogram on 05/26/2024 indicated LVEF 55 to 60%, no RWMA, G1 DD, RV systolic function and size was normal, normal PASP, trivial mitral valve regurgitation with no evidence of stenosis, aortic valve regurgitation is not visualized, sclerosis present without evidence of stenosis.  Patient was last seen in clinic on 05/29/2024 for follow-up.  She reported to be doing well overall and that her bisoprolol  was controlling her palpitations well.  She continued to note some intermittent elevated blood pressures at home, reported she had not been monitoring it regularly although noted some episodes with systolic in the 160s.  Patient's irbesartan  was increased to 150 mg daily.  Today she presents for follow-up.  She reports that she recently had flu A and is still recovering from an increased cough with mucus production.  Patient reports from a cardiac standpoint however she has been feeling significantly better on her current blood pressure medications.  She reports that her palpitations have significantly improved and has only occurred once in the last month and were short in duration.  She notes an occasional fleeting chest pressure that is overall unchanged, question if GI in nature.  She reports that her blood pressure has significantly improved with systolics ranging between 120 and  130.  She denies any lower extremity edema, orthopnea or PND.  Patient notes that with her recent illness she has had problems with constipation, disimpacted herself resulting in some bleeding, is  planning to follow-up with her PCP.  Discussed ED precautions. ROS: .   Today she denies lower extremity edema, fatigue, melena, hematuria, hemoptysis, diaphoresis, weakness, presyncope, syncope, orthopnea, and PND.  All other systems are reviewed and otherwise negative. Studies Reviewed: SABRA   EKG:  EKG is not ordered today.  CV Studies: Cardiac studies reviewed are outlined and summarized above. Otherwise please see EMR for full report. Cardiac Studies & Procedures   ______________________________________________________________________________________________ CARDIAC CATHETERIZATION  CARDIAC CATHETERIZATION 08/07/2022  Conclusion   Ost LAD to Mid LAD lesion is 5% stenosed & Mid LAD lesion is 15% stenosed.(calcified)   Mid RCA to Dist RCA lesion is 30% stenosed.   The left ventricular systolic function is normal.  The left ventricular ejection fraction is 55-65% by visual estimate.   LV end diastolic pressure is normal.  POST-OPERATIVE DIAGNOSIS: FALSE POSITIVE STRESS TEST Angiographically Minimal Coronary Disease: Mild diffuse disease in the RCA with no significant lesion.  30% mid-distal lesion.  Small PDA major PL branch. Diffuse proximal LAD calcification with no significant stenosis.  At most 15% mid LAD with several small diagonal branches. LCx gives rise to a high OM branch that courses as a Ramus Intermedius followed by 1 tiny OM and 2 smaller lateral OM branches with mild diffuse disease. EF 55 to 60% normal wall motion.  Normal EDP.  PLAN OF CARE: Discharge to home after PACU She will be discharged after bedrest, continue medical management and fo getting more palpitations    Kristina Clay, MD  Findings Coronary Findings Diagnostic  Dominance: Right  Left Main  Left Anterior Descending Ost LAD to Mid LAD lesion is 5% stenosed. The lesion is mildly calcified. Mid LAD lesion is 15% stenosed.  First Diagonal Branch Vessel is small in size.  First Septal  Branch Vessel is small in size.  Second Diagonal Branch Vessel is small in size.  Third Diagonal Branch Vessel is small in size.  Left Anterior Descending Ost LAD to Mid LAD lesion is 5% stenosed. The lesion is mildly calcified. Mid LAD lesion is 15% stenosed.  First Diagonal Branch Vessel is small in size.  First Septal Branch Vessel is small in size.  Second Diagonal Branch Vessel is small in size.  Third Diagonal Branch Vessel is small in size.  Left Circumflex  Second Obtuse Marginal Branch Vessel is small in size.  Third Obtuse Marginal Branch There is mild disease in the vessel.  Left Posterior Atrioventricular Artery Vessel is small in size.  Right Coronary Artery Vessel was injected. Vessel is normal in caliber and moderate in size. There is mild focal disease in the vessel. Mid RCA to Dist RCA lesion is 30% stenosed.  Acute Marginal Branch Vessel is small in size.  Right Ventricular Branch Vessel is small in size.  Right Posterior Descending Artery Vessel is small in size.  First Right Posterolateral Branch Vessel is small in size.  Third Right Posterolateral Branch Vessel is moderate in size.  Intervention  No interventions have been documented.     ECHOCARDIOGRAM  ECHOCARDIOGRAM COMPLETE 05/26/2024  Narrative ECHOCARDIOGRAM REPORT    Patient Name:   Kristina Richard Date of Exam: 05/26/2024 Medical Rec #:  [id number]         Height:       62.0 in Accession #:  [id number]        Weight:       166.0 lb Date of Birth:  04/13/1941         BSA:          1.766 m Patient Age:    83 years          BP:           186/89 mmHg Patient Gender: F                 HR:           66 bpm. Exam Location:  [address]  Procedure: 2D Echo, Cardiac Doppler and Color Doppler (Both Spectral and Color Flow Doppler were utilized during procedure).  Indications:    Dyspnea R06.00  History:        Patient has no prior history of Echocardiogram  examinations. Risk Factors:Hypertension.  Sonographer:    Augustin Seals RDCS Referring Phys: [id number] Refoel Palladino D Tiyon Sanor  IMPRESSIONS   1. Left ventricular ejection fraction, by estimation, is 55 to 60%. The left ventricle has normal function. The left ventricle has no regional wall motion abnormalities. Left ventricular diastolic parameters are consistent with Grade I diastolic dysfunction (impaired relaxation). 2. Right ventricular systolic function is normal. The right ventricular size is normal. There is normal pulmonary artery systolic pressure. 3. The mitral valve is normal in structure. Trivial mitral valve regurgitation. No evidence of mitral stenosis. 4. Tricuspid valve regurgitation is mild to moderate. 5. The aortic valve is tricuspid. Aortic valve regurgitation is not visualized. Aortic valve sclerosis is present, with no evidence of aortic valve stenosis. 6. The inferior vena cava is normal in size with greater than 50% respiratory variability, suggesting right atrial pressure of 3 mmHg.  FINDINGS Left Ventricle: Left ventricular ejection fraction, by estimation, is 55 to 60%. The left ventricle has normal function. The left ventricle has no regional wall motion abnormalities. The left ventricular internal cavity size was normal in size. There is no left ventricular hypertrophy. Left ventricular diastolic parameters are consistent with Grade I diastolic dysfunction (impaired relaxation).  Right Ventricle: The right ventricular size is normal. Right ventricular systolic function is normal. There is normal pulmonary artery systolic pressure. The tricuspid regurgitant velocity is 2.83 m/s, and with an assumed right atrial pressure of 3 mmHg, the estimated right ventricular systolic pressure is 35.0 mmHg.  Left Atrium: Left atrial size was normal in size.  Right Atrium: Right atrial size was normal in size.  Pericardium: There is no evidence of pericardial effusion.  Mitral  Valve: The mitral valve is normal in structure. Trivial mitral valve regurgitation. No evidence of mitral valve stenosis.  Tricuspid Valve: The tricuspid valve is normal in structure. Tricuspid valve regurgitation is mild to moderate. No evidence of tricuspid stenosis.  Aortic Valve: The aortic valve is tricuspid. Aortic valve regurgitation is not visualized. Aortic valve sclerosis is present, with no evidence of aortic valve stenosis.  Pulmonic Valve: The pulmonic valve was normal in structure. Pulmonic valve regurgitation is trivial. No evidence of pulmonic stenosis.  Aorta: The aortic root is normal in size and structure.  Venous: The inferior vena cava is normal in size with greater than 50% respiratory variability, suggesting right atrial pressure of 3 mmHg.  IAS/Shunts: No atrial level shunt detected by color flow Doppler.  Additional Comments: 3D was performed not requiring image post processing on an independent workstation and was normal.   LEFT VENTRICLE PLAX 2D LVIDd:  4.40 cm   Diastology LVIDs:         2.90 cm   LV e' medial:    6.31 cm/s LV PW:         0.90 cm   LV E/e' medial:  10.4 LV IVS:        0.80 cm   LV e' lateral:   9.46 cm/s LVOT diam:     2.00 cm   LV E/e' lateral: 6.9 LV SV:         54 LV SV Index:   30 LVOT Area:     3.14 cm  3D Volume EF: 3D EF:        60 % LV EDV:       80 ml LV ESV:       32 ml LV SV:        48 ml  RIGHT VENTRICLE             IVC RV Basal diam:  3.10 cm     IVC diam: 1.40 cm RV Mid diam:    2.00 cm RV S prime:     12.70 cm/s TAPSE (M-mode): 2.2 cm  LEFT ATRIUM           Index        RIGHT ATRIUM           Index LA diam:      2.80 cm 1.59 cm/m   RA Area:     10.60 cm LA Vol (A2C): 41.3 ml 23.38 ml/m  RA Volume:   21.70 ml  12.29 ml/m LA Vol (A4C): 10.3 ml 5.83 ml/m AORTIC VALVE LVOT Vmax:   65.80 cm/s LVOT Vmean:  44.500 cm/s LVOT VTI:    0.171 m  AORTA Ao Root diam: 3.10 cm Ao Asc diam:  3.70 cm  MITRAL  VALVE               TRICUSPID VALVE MV Area (PHT): 3.08 cm    TR Peak grad:   32.0 mmHg MV Decel Time: 246 msec    TR Vmax:        283.00 cm/s MV E velocity: 65.50 cm/s MV A velocity: 74.90 cm/s  SHUNTS MV E/A ratio:  0.87        Systemic VTI:  0.17 m Systemic Diam: 2.00 cm  Redell Shallow MD Electronically signed by Redell Shallow MD Signature Date/Time: 05/26/2024/2:04:14 PM    Final          ______________________________________________________________________________________________       Current Reported Medications:.    Active Medications[1]  Physical Exam:    VS:  BP 128/62   Pulse (!) 56   Ht 5' 2 (1.575 m)   Wt 165 lb 6.4 oz (75 kg)   SpO2 97%   BMI 30.25 kg/m    Wt Readings from Last 3 Encounters:  07/12/24 165 lb 6.4 oz (75 kg)  05/29/24 169 lb (76.7 kg)  04/13/24 166 lb (75.3 kg)    GEN: Well nourished, well developed in no acute distress NECK: No JVD; No carotid bruits CARDIAC: RRR, no murmurs, rubs, gallops RESPIRATORY:  Clear to auscultation without rales, wheezing or rhonchi  ABDOMEN: Soft, non-tender, non-distended EXTREMITIES:  No edema; No acute deformity     Asessement and Plan:.    HTN: Blood pressure today 128/62.  Patient notes that her systolic blood pressure at home has been averaging between 120 and 130, has been tolerating her medications very well.  Continue bisoprolol  10  mg daily, hydrochlorothiazide  12.5 mg daily and irbesartan  150 mg daily.  Palpitations: Patient with history of palpitations described as a fluttering and forceful beats. Per notes prior Holter monitor indicated benign premature beats without atrial fibrillation.  Today she reports that her palpitations have significantly improved.  Continue bisoprolol  10 mg daily.  Hyperlipidemia/elevated LFTs: Patient noted to have increases in LFTs while on atorvastatin.  Her atorvastatin has been held with improvement, LFTs have normalized.  At last office visit discussed  alternative cholesterol medications, at this time patient is not interested in further medications.  She is working on improving her diet and exercise.  Patient agreeable to rechecking fasting lipids and LFTs in 3 months.   Disposition: F/u with Dr. Anner in 3-4 months.   Signed, Cherlyn Syring D Kashish Yglesias, NP       [1]  Current Meds  Medication Sig   Ascorbic Acid 500 MG CAPS as directed Orally   aspirin  EC 81 MG tablet Take 1 tablet (81 mg total) by mouth daily. Swallow whole.   atorvastatin (LIPITOR) 20 MG tablet Take 20 mg by mouth daily.   bisoprolol  (ZEBETA ) 10 MG tablet Take 1 tablet (10 mg total) by mouth daily.   cholecalciferol  (VITAMIN D3) 25 MCG (1000 UNIT) tablet Take 3,000 Units by mouth 3 (three) times a week.   hydrochlorothiazide  (MICROZIDE ) 12.5 MG capsule Take 1 capsule (12.5 mg total) by mouth daily.   irbesartan  (AVAPRO ) 150 MG tablet Take 1 tablet (150 mg total) by mouth daily.   LORazepam  (ATIVAN ) 2 MG tablet TAKE 1/2 TO 1 TABLET BY MOUTH AT BEDTIME AS NEEDED FOR INSOMINIA   Lysine HCl (L-FORMULA LYSINE HCL) 500 MG TABS Take 500 mg by mouth daily.   magnesium gluconate (MAGONATE) 500 MG tablet Take 500 mg by mouth daily.   predniSONE (DELTASONE) 20 MG tablet Take 20 mg by mouth daily.   promethazine -dextromethorphan (PROMETHAZINE -DM) 6.25-15 MG/5ML syrup Take 5 mLs by mouth every 6 (six) hours as needed.   Turmeric 500 MG CAPS Take by mouth.   Zinc 50 MG TABS 1 tablet Orally Once a day   "

## 2024-08-09 ENCOUNTER — Telehealth: Payer: Self-pay | Admitting: Cardiology

## 2024-08-09 MED ORDER — HYDROCHLOROTHIAZIDE 12.5 MG PO CAPS: 12.5000 mg | ORAL_CAPSULE | Freq: Every day | ORAL | 1 refills | Status: DC

## 2024-08-09 NOTE — Telephone Encounter (Signed)
 Refill sent

## 2024-08-09 NOTE — Telephone Encounter (Signed)
 Patient is following up regarding this request.

## 2024-08-10 ENCOUNTER — Other Ambulatory Visit: Payer: Self-pay

## 2024-08-10 MED ORDER — HYDROCHLOROTHIAZIDE 12.5 MG PO CAPS: 12.5000 mg | ORAL_CAPSULE | Freq: Every day | ORAL | 1 refills | Status: AC

## 2024-08-18 ENCOUNTER — Telehealth: Payer: Self-pay | Admitting: Internal Medicine

## 2024-08-18 NOTE — Telephone Encounter (Signed)
 Copied from CRM [id number]. Topic: Appointments - Scheduling Inquiry for Clinic >> Aug 18, 2024 12:33 PM Tysheama G wrote: Reason for CRM: Patient is requesting to see if Dr.Copland will take her as a new patient again. She goes by [facility], She was her patient in 2020 (during covid) so she wanted to know if she would take her again. Callback number [phone]

## 2024-08-18 NOTE — Telephone Encounter (Signed)
 Unable to lvm. Sent pt a Mychart to inform.

## 2024-10-25 ENCOUNTER — Ambulatory Visit: Admitting: Cardiology
